# Patient Record
Sex: Male | Born: 1960
Health system: Southern US, Community
[De-identification: ages and names within clinical notes are randomized; demographics above are authoritative.]

## PROBLEM LIST (undated history)

## (undated) DIAGNOSIS — I10 Essential (primary) hypertension: Secondary | ICD-10-CM

## (undated) DIAGNOSIS — F32A Depression, unspecified: Secondary | ICD-10-CM

## (undated) DIAGNOSIS — E669 Obesity, unspecified: Secondary | ICD-10-CM

## (undated) DIAGNOSIS — G43909 Migraine, unspecified, not intractable, without status migrainosus: Secondary | ICD-10-CM

## (undated) DIAGNOSIS — F329 Major depressive disorder, single episode, unspecified: Secondary | ICD-10-CM

## (undated) DIAGNOSIS — K760 Fatty (change of) liver, not elsewhere classified: Secondary | ICD-10-CM

## (undated) DIAGNOSIS — R748 Abnormal levels of other serum enzymes: Secondary | ICD-10-CM

## (undated) HISTORY — PX: NASAL SINUS SURGERY: SHX719

## (undated) HISTORY — DX: Major depressive disorder, single episode, unspecified: F32.9

## (undated) HISTORY — DX: Abnormal levels of other serum enzymes: R74.8

## (undated) HISTORY — DX: Migraine, unspecified, not intractable, without status migrainosus: G43.909

## (undated) HISTORY — PX: GROIN MASS OPEN BIOPSY: SHX1714

## (undated) HISTORY — DX: Depression, unspecified: F32.A

## (undated) HISTORY — DX: Fatty (change of) liver, not elsewhere classified: K76.0

## (undated) HISTORY — DX: Essential (primary) hypertension: I10

## (undated) HISTORY — DX: Obesity, unspecified: E66.9

---

## 2006-08-06 ENCOUNTER — Emergency Department (HOSPITAL_COMMUNITY): Admission: EM | Admit: 2006-08-06 | Discharge: 2006-08-06 | Payer: Self-pay | Admitting: Family Medicine

## 2008-02-10 ENCOUNTER — Encounter (INDEPENDENT_AMBULATORY_CARE_PROVIDER_SITE_OTHER): Payer: Self-pay | Admitting: *Deleted

## 2008-02-10 ENCOUNTER — Ambulatory Visit (HOSPITAL_COMMUNITY): Admission: RE | Admit: 2008-02-10 | Discharge: 2008-02-10 | Payer: Self-pay | Admitting: *Deleted

## 2008-02-18 ENCOUNTER — Encounter: Admission: RE | Admit: 2008-02-18 | Discharge: 2008-02-18 | Payer: Self-pay | Admitting: *Deleted

## 2008-02-18 IMAGING — CT CT PELVIS W/ CM
2 of 6 series · 14 of 46 positions shown, 19 images · IV contrast ([ID] [id number])
Comparison: None available

CLINICAL DATA: Postsurgical blood clot.  Question DVT.

CT PELVIS WITH CONTRAST
TECHNIQUE: Multidetector CT imaging of the pelvis was performed
following the standard protocol during administration of
intravenous contrast.
Contrast: None available.

[Series 3: portal venous phase · axial · portal-venous · 0.98mm/px · z∈[-428,-163]mm · 11 of 63 slices shown, 16 images]
[im 5/63  soft-tissue]
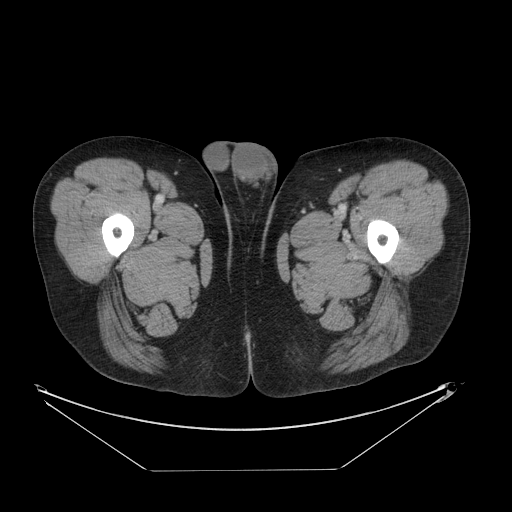
[im 5/63  bone]
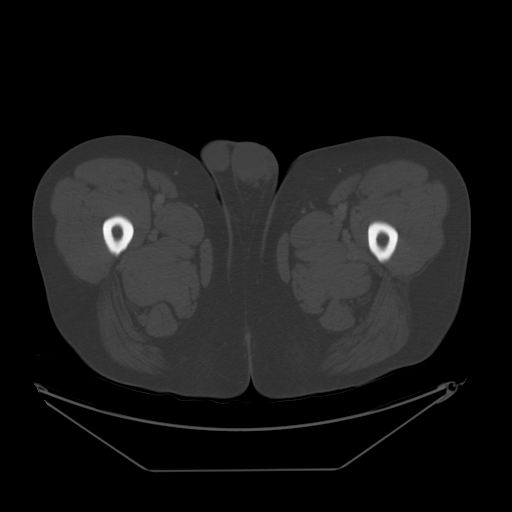
[im 9/63  soft-tissue]
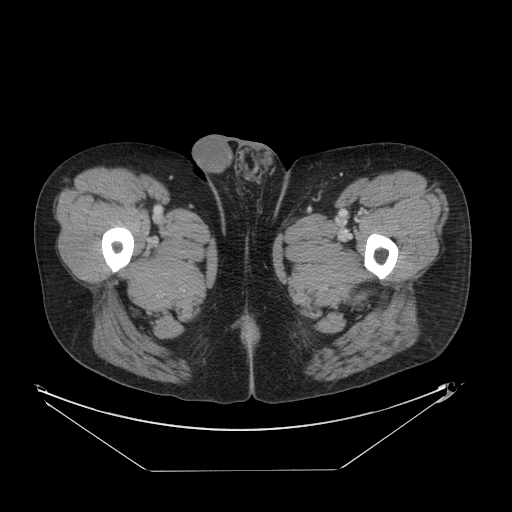
[im 18/63  soft-tissue]
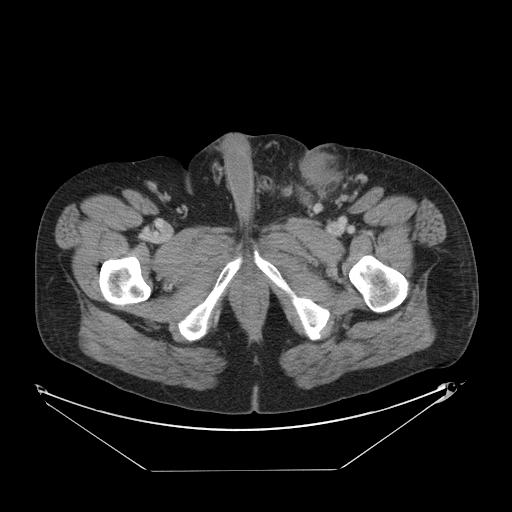
[im 23/63  soft-tissue]
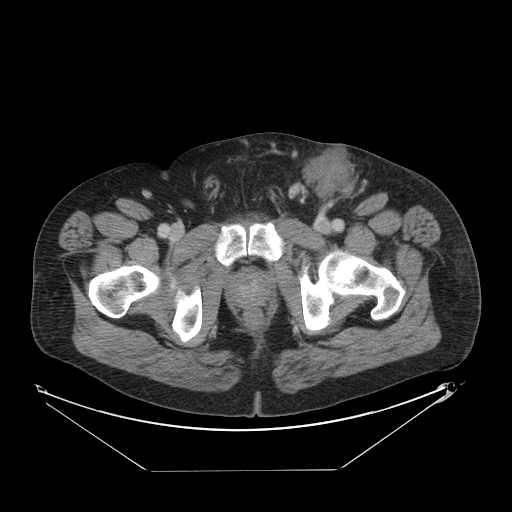
[im 27/63  soft-tissue]
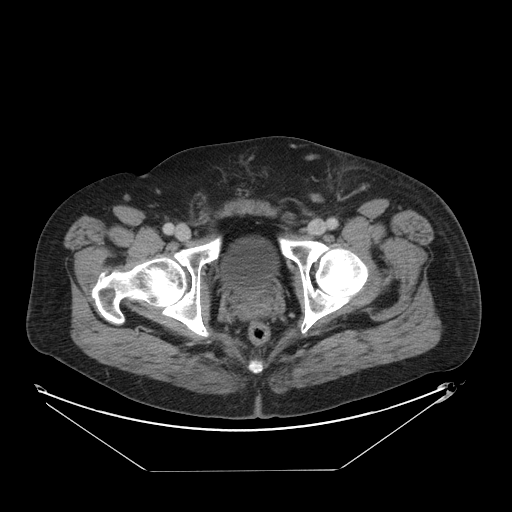
[im 36/63  soft-tissue]
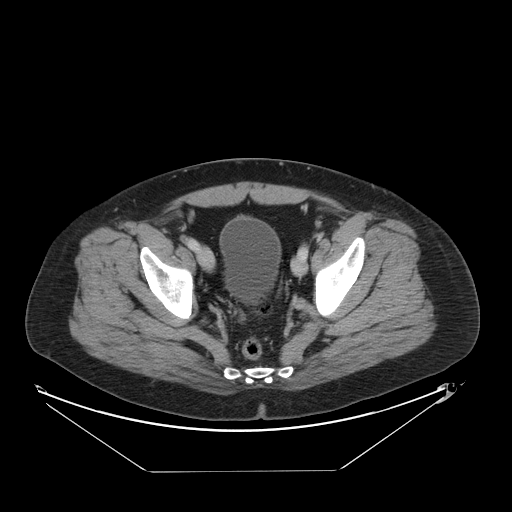
[im 40/63  soft-tissue]
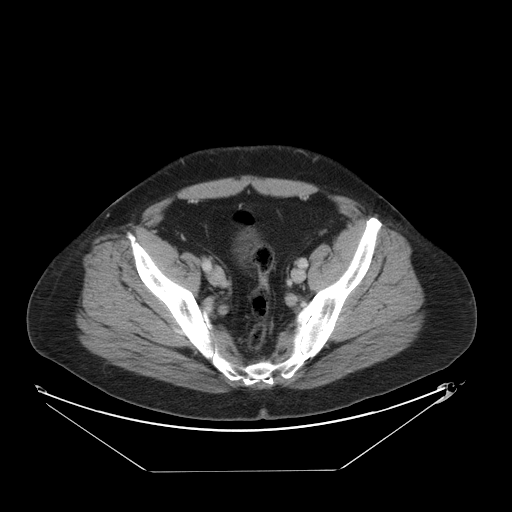
[im 45/63  soft-tissue]
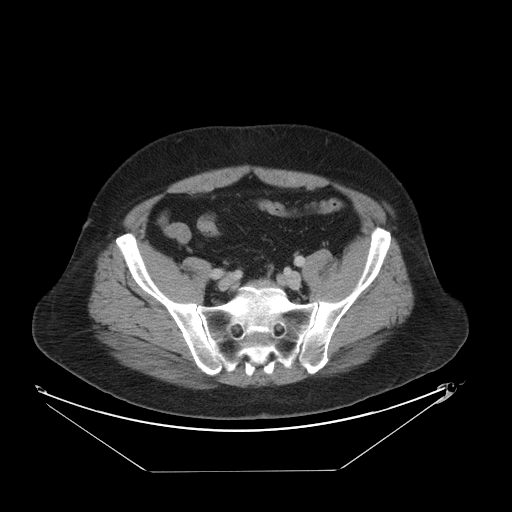
[im 45/63  lung]
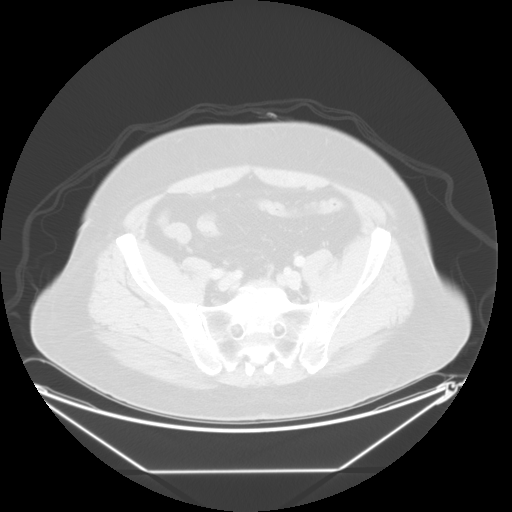
[im 49/63  lung]
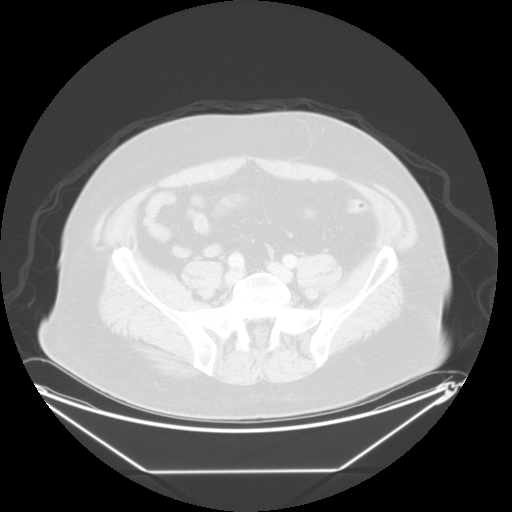
[im 54/63  soft-tissue]
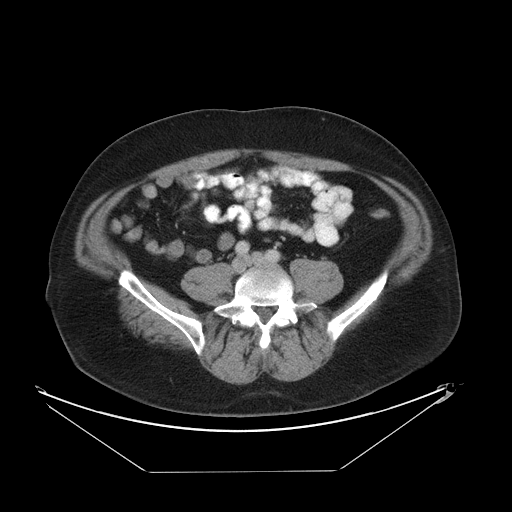
[im 54/63  lung]
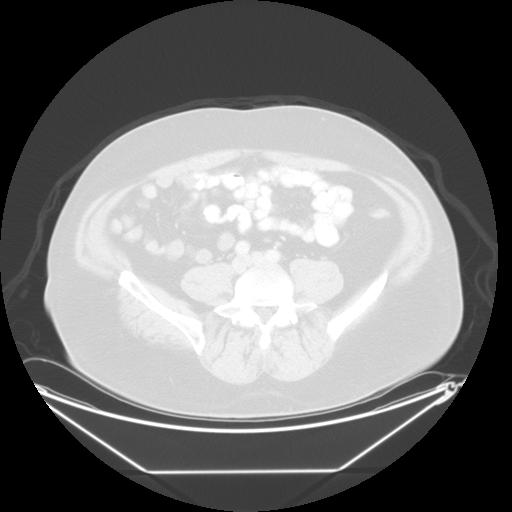
[im 54/63  bone]
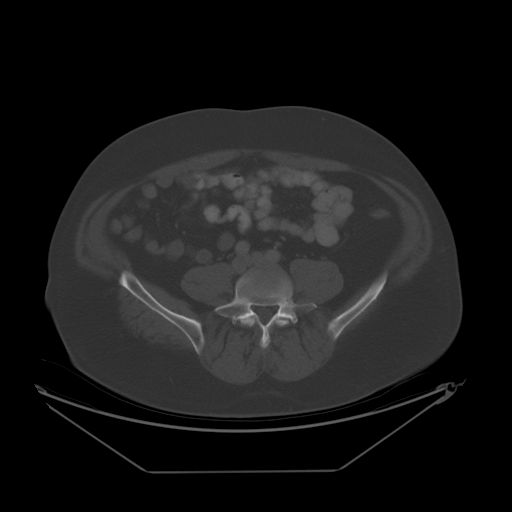
[im 58/63  soft-tissue]
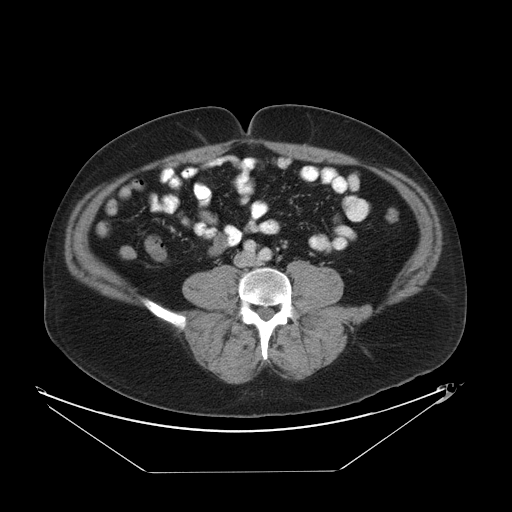
[im 58/63  lung]
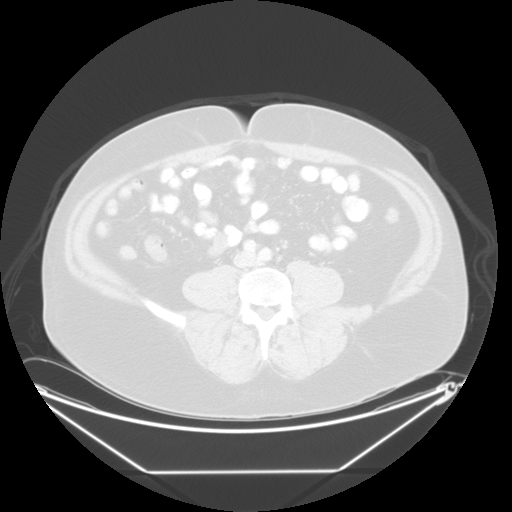

[Series 602: sagittal body · sagittal · 0.98mm/px · 3 of 180 slices shown]
[im 45/180  soft-tissue]
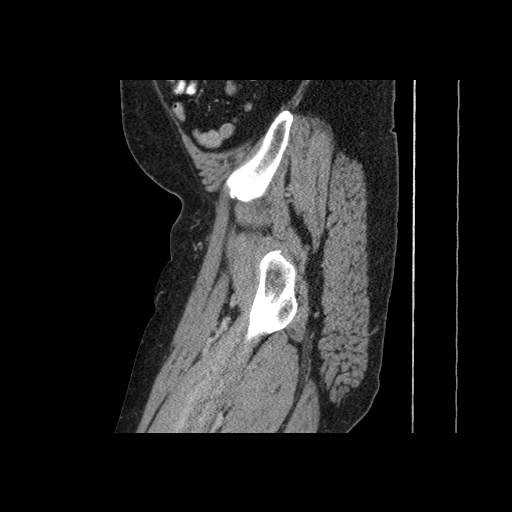
[im 90/180  soft-tissue]
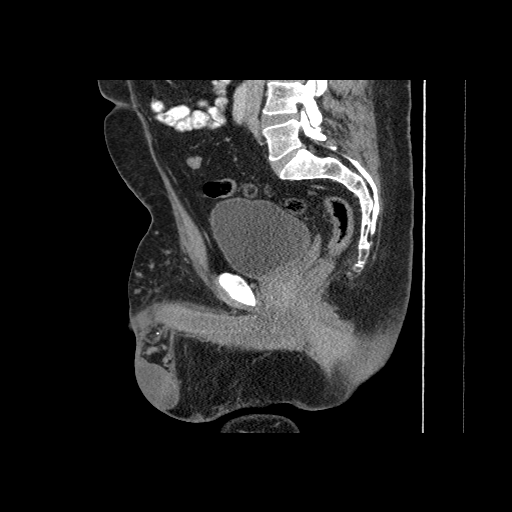
[im 135/180  soft-tissue]
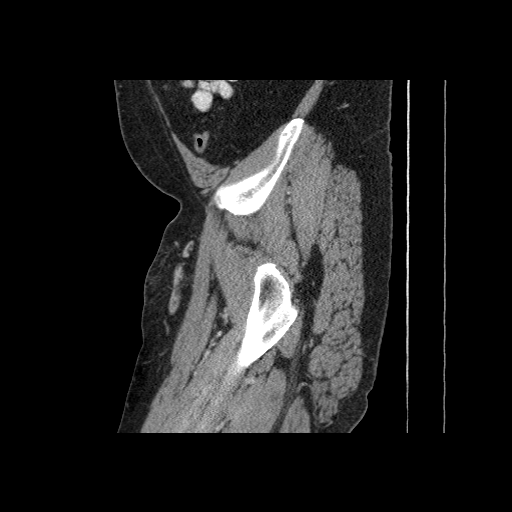

[14 of 46 positions shown; findings below may reference images not displayed]

FINDINGS: The rectosigmoid colon is within normal limits.  Most of
the colon is collapsed.  The appendix is visualized and within
normal limits.  The visualized small bowel is unremarkable.
Urinary bladder is normal.  Small amount of fat can be seen
extending into the inguinal canals bilaterally without bowel.
There is a subcutaneous soft tissue density on the left superficial
to the femoral vein.  The area is somewhat ill-defined, measuring
5.1 x 4.3 x 6.4 centimeters.  A slightly better defined area is
seen just posterior and medial measuring 3.0 x 2.0 cm.  The larger
area may represent to superficial clots.  There is no evidence for
DVT.  Bone windows are unremarkable.
IMPRESSION: 1.  6 cm soft tissue density overlying the left femoral vein most
likely represents superficial clot.  There is no evidence for DVT.
2.  Adjacent soft tissue may represent additional clot or lymph
node.
3.  Imaging within the anatomic pelvis is unremarkable.

## 2008-06-30 ENCOUNTER — Emergency Department (HOSPITAL_COMMUNITY): Admission: EM | Admit: 2008-06-30 | Discharge: 2008-06-30 | Payer: Self-pay | Admitting: Emergency Medicine

## 2009-06-08 ENCOUNTER — Ambulatory Visit (HOSPITAL_COMMUNITY): Admission: RE | Admit: 2009-06-08 | Discharge: 2009-06-08 | Payer: Self-pay | Admitting: Otolaryngology

## 2010-09-23 LAB — CREATININE, SERUM
Creatinine, Ser: 0.9 mg/dL (ref 0.4–1.5)
GFR calc non Af Amer: >60 mL/min (ref >60)

## 2010-09-23 LAB — BUN: BUN: 13 mg/dL (ref 6–23)

## 2010-11-05 NOTE — Op Note (Signed)
NAME:  Vernon Murray, Vernon Murray                 ACCOUNT NO.:  [id number]   MEDICAL RECORD NO.:  [id number]          PATIENT TYPE:  AMB   LOCATION:  DAY                          FACILITY:  [facility]   PHYSICIAN:  Alfonse Ras, MD   DATE OF BIRTH:  [birth year]   DATE OF PROCEDURE:  DATE OF DISCHARGE:                               OPERATIVE REPORT   PREOPERATIVE DIAGNOSIS:  Left inguinal mass.   POSTOPERATIVE DIAGNOSIS:  Left inguinal mass, probable hematoma,  questionable enlarged lymph node.   PROCEDURE:  Excision of left inguinal mass.   ANESTHESIA:  General.   SURGEON:  Alfonse Ras, M.D.   DESCRIPTION OF PROCEDURE:  The patient was taken to the operating room  and placed in a supine position.  After adequate general anesthesia was  induced using laryngeal mask, the left inguinal region was prepped and  draped in normal sterile fashion.  Using an oblique incision over the  palpable mass in the left inguinal region, I dissected down to the skin  and subcutaneous tissue.  Of note, there was significant amount of  oozing from the subcutaneous tissue which was controlled with  electrocautery.  The palpable mass was identified, it was quite dark in  color, and first I was concerned about melanoma.  I mobilized the mass  using a blunt and sharp dissection, it was interrupted and a hematoma  was found.  This was aspirated with the capsule surrounding it which may  possibly be a lymph node, it was completely excised and sent for  pathologic evaluation.  Adequate hemostasis was assured with one  interrupted 2-0 silk suture and a Bovie electrocautery.  Tissues were  injected with 0.5 Marcaine and the skin was closed with subcuticular 3-0  Monocryl and Dermabond was placed.  The patient tolerated the procedure  well and went to PACU in good condition.      Alfonse Ras, MD  Electronically Signed     KRE/MEDQ  D:  02/10/2008  T:  02/10/2008  Job:  [id number]

## 2011-08-07 ENCOUNTER — Ambulatory Visit (INDEPENDENT_AMBULATORY_CARE_PROVIDER_SITE_OTHER): Payer: Commercial Managed Care - PPO | Admitting: Psychology

## 2011-08-07 DIAGNOSIS — F331 Major depressive disorder, recurrent, moderate: Secondary | ICD-10-CM

## 2011-08-07 DIAGNOSIS — F411 Generalized anxiety disorder: Secondary | ICD-10-CM

## 2011-08-14 ENCOUNTER — Ambulatory Visit (INDEPENDENT_AMBULATORY_CARE_PROVIDER_SITE_OTHER): Payer: Commercial Managed Care - PPO | Admitting: Psychology

## 2011-08-14 DIAGNOSIS — F411 Generalized anxiety disorder: Secondary | ICD-10-CM

## 2011-08-14 DIAGNOSIS — F331 Major depressive disorder, recurrent, moderate: Secondary | ICD-10-CM

## 2011-08-21 ENCOUNTER — Ambulatory Visit (INDEPENDENT_AMBULATORY_CARE_PROVIDER_SITE_OTHER): Payer: 59 | Admitting: Psychology

## 2011-08-21 DIAGNOSIS — F411 Generalized anxiety disorder: Secondary | ICD-10-CM

## 2011-08-21 DIAGNOSIS — F331 Major depressive disorder, recurrent, moderate: Secondary | ICD-10-CM

## 2011-08-28 ENCOUNTER — Ambulatory Visit (INDEPENDENT_AMBULATORY_CARE_PROVIDER_SITE_OTHER): Payer: 59 | Admitting: Psychology

## 2011-08-28 DIAGNOSIS — F411 Generalized anxiety disorder: Secondary | ICD-10-CM

## 2011-08-28 DIAGNOSIS — F331 Major depressive disorder, recurrent, moderate: Secondary | ICD-10-CM

## 2011-09-04 ENCOUNTER — Ambulatory Visit (INDEPENDENT_AMBULATORY_CARE_PROVIDER_SITE_OTHER): Payer: 59 | Admitting: Psychology

## 2011-09-04 DIAGNOSIS — F331 Major depressive disorder, recurrent, moderate: Secondary | ICD-10-CM

## 2011-09-04 DIAGNOSIS — F411 Generalized anxiety disorder: Secondary | ICD-10-CM

## 2011-09-11 ENCOUNTER — Ambulatory Visit (INDEPENDENT_AMBULATORY_CARE_PROVIDER_SITE_OTHER): Payer: 59 | Admitting: Psychology

## 2011-09-11 DIAGNOSIS — F411 Generalized anxiety disorder: Secondary | ICD-10-CM

## 2011-09-11 DIAGNOSIS — F331 Major depressive disorder, recurrent, moderate: Secondary | ICD-10-CM

## 2011-09-26 ENCOUNTER — Ambulatory Visit (INDEPENDENT_AMBULATORY_CARE_PROVIDER_SITE_OTHER): Payer: 59 | Admitting: Psychology

## 2011-09-26 DIAGNOSIS — F411 Generalized anxiety disorder: Secondary | ICD-10-CM

## 2011-09-26 DIAGNOSIS — F331 Major depressive disorder, recurrent, moderate: Secondary | ICD-10-CM

## 2011-10-03 ENCOUNTER — Ambulatory Visit (INDEPENDENT_AMBULATORY_CARE_PROVIDER_SITE_OTHER): Payer: 59 | Admitting: Psychology

## 2011-10-03 DIAGNOSIS — F331 Major depressive disorder, recurrent, moderate: Secondary | ICD-10-CM

## 2011-10-03 DIAGNOSIS — F411 Generalized anxiety disorder: Secondary | ICD-10-CM

## 2011-10-09 ENCOUNTER — Ambulatory Visit (INDEPENDENT_AMBULATORY_CARE_PROVIDER_SITE_OTHER): Payer: 59 | Admitting: Psychology

## 2011-10-09 DIAGNOSIS — F411 Generalized anxiety disorder: Secondary | ICD-10-CM

## 2011-10-09 DIAGNOSIS — F331 Major depressive disorder, recurrent, moderate: Secondary | ICD-10-CM

## 2011-10-21 ENCOUNTER — Ambulatory Visit (INDEPENDENT_AMBULATORY_CARE_PROVIDER_SITE_OTHER): Payer: 59 | Admitting: Psychology

## 2011-10-21 DIAGNOSIS — F411 Generalized anxiety disorder: Secondary | ICD-10-CM

## 2011-10-21 DIAGNOSIS — F331 Major depressive disorder, recurrent, moderate: Secondary | ICD-10-CM

## 2011-11-06 ENCOUNTER — Ambulatory Visit (INDEPENDENT_AMBULATORY_CARE_PROVIDER_SITE_OTHER): Payer: 59 | Admitting: Psychology

## 2011-11-06 DIAGNOSIS — F411 Generalized anxiety disorder: Secondary | ICD-10-CM

## 2011-11-06 DIAGNOSIS — F331 Major depressive disorder, recurrent, moderate: Secondary | ICD-10-CM

## 2011-11-12 ENCOUNTER — Ambulatory Visit (INDEPENDENT_AMBULATORY_CARE_PROVIDER_SITE_OTHER): Payer: 59 | Admitting: Psychology

## 2011-11-12 DIAGNOSIS — F411 Generalized anxiety disorder: Secondary | ICD-10-CM

## 2011-11-12 DIAGNOSIS — F331 Major depressive disorder, recurrent, moderate: Secondary | ICD-10-CM

## 2011-11-19 ENCOUNTER — Ambulatory Visit (INDEPENDENT_AMBULATORY_CARE_PROVIDER_SITE_OTHER): Payer: 59 | Admitting: Psychology

## 2011-11-19 DIAGNOSIS — F411 Generalized anxiety disorder: Secondary | ICD-10-CM

## 2011-11-19 DIAGNOSIS — F331 Major depressive disorder, recurrent, moderate: Secondary | ICD-10-CM

## 2011-11-27 ENCOUNTER — Ambulatory Visit (INDEPENDENT_AMBULATORY_CARE_PROVIDER_SITE_OTHER): Payer: 59 | Admitting: Psychology

## 2011-11-27 DIAGNOSIS — F331 Major depressive disorder, recurrent, moderate: Secondary | ICD-10-CM

## 2011-11-27 DIAGNOSIS — F411 Generalized anxiety disorder: Secondary | ICD-10-CM

## 2011-12-04 ENCOUNTER — Ambulatory Visit (INDEPENDENT_AMBULATORY_CARE_PROVIDER_SITE_OTHER): Payer: 59 | Admitting: Psychology

## 2011-12-04 DIAGNOSIS — F411 Generalized anxiety disorder: Secondary | ICD-10-CM

## 2011-12-04 DIAGNOSIS — F331 Major depressive disorder, recurrent, moderate: Secondary | ICD-10-CM

## 2011-12-24 ENCOUNTER — Ambulatory Visit (INDEPENDENT_AMBULATORY_CARE_PROVIDER_SITE_OTHER): Payer: 59 | Admitting: Psychology

## 2011-12-24 DIAGNOSIS — F331 Major depressive disorder, recurrent, moderate: Secondary | ICD-10-CM

## 2011-12-24 DIAGNOSIS — F411 Generalized anxiety disorder: Secondary | ICD-10-CM

## 2012-02-02 ENCOUNTER — Ambulatory Visit (INDEPENDENT_AMBULATORY_CARE_PROVIDER_SITE_OTHER): Payer: 59 | Admitting: Psychology

## 2012-02-02 DIAGNOSIS — F331 Major depressive disorder, recurrent, moderate: Secondary | ICD-10-CM

## 2012-02-02 DIAGNOSIS — F411 Generalized anxiety disorder: Secondary | ICD-10-CM

## 2012-03-01 ENCOUNTER — Ambulatory Visit (INDEPENDENT_AMBULATORY_CARE_PROVIDER_SITE_OTHER): Payer: 59 | Admitting: Psychology

## 2012-03-01 DIAGNOSIS — F411 Generalized anxiety disorder: Secondary | ICD-10-CM

## 2012-03-01 DIAGNOSIS — F331 Major depressive disorder, recurrent, moderate: Secondary | ICD-10-CM

## 2012-05-12 ENCOUNTER — Ambulatory Visit (INDEPENDENT_AMBULATORY_CARE_PROVIDER_SITE_OTHER): Payer: 59 | Admitting: Psychology

## 2012-05-12 DIAGNOSIS — F411 Generalized anxiety disorder: Secondary | ICD-10-CM

## 2012-05-12 DIAGNOSIS — F331 Major depressive disorder, recurrent, moderate: Secondary | ICD-10-CM

## 2012-06-30 ENCOUNTER — Ambulatory Visit (INDEPENDENT_AMBULATORY_CARE_PROVIDER_SITE_OTHER): Payer: 59 | Admitting: Psychology

## 2012-06-30 DIAGNOSIS — F331 Major depressive disorder, recurrent, moderate: Secondary | ICD-10-CM

## 2012-06-30 DIAGNOSIS — F411 Generalized anxiety disorder: Secondary | ICD-10-CM

## 2012-09-01 ENCOUNTER — Ambulatory Visit: Payer: 59 | Admitting: Psychology

## 2012-09-07 ENCOUNTER — Ambulatory Visit (INDEPENDENT_AMBULATORY_CARE_PROVIDER_SITE_OTHER): Payer: 59 | Admitting: Psychology

## 2012-09-07 DIAGNOSIS — F331 Major depressive disorder, recurrent, moderate: Secondary | ICD-10-CM

## 2012-09-07 DIAGNOSIS — F411 Generalized anxiety disorder: Secondary | ICD-10-CM

## 2012-12-07 ENCOUNTER — Ambulatory Visit (INDEPENDENT_AMBULATORY_CARE_PROVIDER_SITE_OTHER): Payer: 59 | Admitting: Psychology

## 2012-12-07 DIAGNOSIS — F331 Major depressive disorder, recurrent, moderate: Secondary | ICD-10-CM

## 2012-12-07 DIAGNOSIS — F411 Generalized anxiety disorder: Secondary | ICD-10-CM

## 2014-01-23 ENCOUNTER — Ambulatory Visit: Payer: Self-pay | Admitting: Family Medicine

## 2014-02-02 ENCOUNTER — Other Ambulatory Visit: Payer: Self-pay | Admitting: Urgent Care

## 2014-02-02 LAB — HEPATIC FUNCTION PANEL A (ARMC)
ALBUMIN: 4.1 g/dL (ref 3.4–5.0)
Alkaline Phosphatase: 68 U/L
Bilirubin, Direct: 0.2 mg/dL (ref 0.00–0.20)
Bilirubin,Total: 0.6 mg/dL (ref 0.2–1.0)
SGOT(AST): 53 U/L — ABNORMAL HIGH (ref 15–37)
SGPT (ALT): 150 U/L — ABNORMAL HIGH
Total Protein: 7.5 g/dL (ref 6.4–8.2)

## 2014-02-02 LAB — FERRITIN: Ferritin (ARMC): 295 ng/mL (ref 8–388)

## 2014-02-02 LAB — PROTIME-INR
INR: 1
Prothrombin Time: 13.2 secs (ref 11.5–14.7)

## 2014-02-02 LAB — IRON: IRON: 106 ug/dL (ref 65–175)

## 2014-02-06 LAB — AFP TUMOR MARKER: AFP-Tumor Marker: 3.2 ng/mL (ref 0.0–8.3)

## 2014-02-07 ENCOUNTER — Other Ambulatory Visit (HOSPITAL_COMMUNITY): Payer: Self-pay | Admitting: Sports Medicine

## 2014-02-07 DIAGNOSIS — M75122 Complete rotator cuff tear or rupture of left shoulder, not specified as traumatic: Secondary | ICD-10-CM

## 2014-02-16 ENCOUNTER — Ambulatory Visit (HOSPITAL_COMMUNITY)
Admission: RE | Admit: 2014-02-16 | Discharge: 2014-02-16 | Disposition: A | Discharge status: Home / Self Care | Payer: 59 | Source: Ambulatory Visit | Attending: Sports Medicine | Admitting: Sports Medicine

## 2014-02-16 DIAGNOSIS — R937 Abnormal findings on diagnostic imaging of other parts of musculoskeletal system: Secondary | ICD-10-CM | POA: Diagnosis not present

## 2014-02-16 DIAGNOSIS — M25619 Stiffness of unspecified shoulder, not elsewhere classified: Secondary | ICD-10-CM | POA: Insufficient documentation

## 2014-02-16 DIAGNOSIS — M25519 Pain in unspecified shoulder: Secondary | ICD-10-CM | POA: Diagnosis present

## 2014-02-16 DIAGNOSIS — M75122 Complete rotator cuff tear or rupture of left shoulder, not specified as traumatic: Secondary | ICD-10-CM

## 2014-03-02 ENCOUNTER — Other Ambulatory Visit: Payer: Self-pay | Admitting: Urgent Care

## 2014-03-02 LAB — HEPATIC FUNCTION PANEL A (ARMC)
ALT: 102 U/L — AB
Albumin: 3.6 g/dL (ref 3.4–5.0)
Alkaline Phosphatase: 54 U/L
BILIRUBIN DIRECT: 0.2 mg/dL (ref 0.00–0.20)
BILIRUBIN TOTAL: 0.5 mg/dL (ref 0.2–1.0)
SGOT(AST): 54 U/L — ABNORMAL HIGH (ref 15–37)
Total Protein: 6.8 g/dL (ref 6.4–8.2)

## 2014-03-14 ENCOUNTER — Encounter: Payer: Self-pay | Admitting: Sports Medicine

## 2014-03-23 ENCOUNTER — Encounter: Payer: Self-pay | Admitting: Sports Medicine

## 2014-04-23 ENCOUNTER — Encounter: Payer: Self-pay | Admitting: Sports Medicine

## 2014-08-30 ENCOUNTER — Ambulatory Visit: Payer: Self-pay | Admitting: Family Medicine

## 2014-10-10 DIAGNOSIS — E669 Obesity, unspecified: Secondary | ICD-10-CM | POA: Insufficient documentation

## 2014-10-10 DIAGNOSIS — I10 Essential (primary) hypertension: Secondary | ICD-10-CM | POA: Insufficient documentation

## 2014-10-10 DIAGNOSIS — F32A Depression, unspecified: Secondary | ICD-10-CM

## 2014-10-10 DIAGNOSIS — R748 Abnormal levels of other serum enzymes: Secondary | ICD-10-CM | POA: Insufficient documentation

## 2014-10-10 DIAGNOSIS — K760 Fatty (change of) liver, not elsewhere classified: Secondary | ICD-10-CM | POA: Insufficient documentation

## 2014-10-10 DIAGNOSIS — F329 Major depressive disorder, single episode, unspecified: Secondary | ICD-10-CM

## 2014-12-04 ENCOUNTER — Encounter: Payer: Self-pay | Admitting: Family Medicine

## 2014-12-04 ENCOUNTER — Ambulatory Visit (INDEPENDENT_AMBULATORY_CARE_PROVIDER_SITE_OTHER): Payer: 59 | Admitting: Family Medicine

## 2014-12-04 VITALS — BP 138/82 | HR 77 | Temp 97.6°F | Wt 266.0 lb

## 2014-12-04 DIAGNOSIS — E786 Lipoprotein deficiency: Secondary | ICD-10-CM | POA: Diagnosis not present

## 2014-12-04 DIAGNOSIS — R899 Unspecified abnormal finding in specimens from other organs, systems and tissues: Secondary | ICD-10-CM | POA: Diagnosis not present

## 2014-12-04 DIAGNOSIS — D709 Neutropenia, unspecified: Secondary | ICD-10-CM

## 2014-12-04 DIAGNOSIS — R748 Abnormal levels of other serum enzymes: Secondary | ICD-10-CM | POA: Diagnosis not present

## 2014-12-04 DIAGNOSIS — K760 Fatty (change of) liver, not elsewhere classified: Secondary | ICD-10-CM | POA: Diagnosis not present

## 2014-12-04 NOTE — Progress Notes (Signed)
BP 138/82 mmHg  Pulse 77  Temp(Src) 97.6 F (36.4 C)  Wt 266 lb (120.657 kg)  SpO2 96%   Subjective:    Patient ID: Vernon Murray, male    DOB: [birth year], 54 y.o.   MRN: [id number]  HPI: Vernon Murray is a 54 y.o. male  Chief Complaint  Patient presents with  . Elevated Hepatic Enzymes   History of elevated liver enzymes He saw the gastroenterologist His wife would like him to get checked out, have the labs rechecked He feels fine; no abd pain, no nausea, no jaundice  Locked up shoulder, doing therapy; it's the left shoulder  Retired a few months ago and enjoying new activities, trap shooting and wood turning  Low neutrophils noted earlier; no recurrent infections like boils or sinus infections  Low HDL; always been an issues; not sure if inherited; both parents are on a lot of medicines, cholesterol might be one  He does not tolerate aspirin well when we discussed it for cardioprevention, bleeds easily when taking aspirin  Relevant past medical, surgical, family and social history reviewed and updated as indicated. Interim medical history since our last visit reviewed. Allergies and medications reviewed and updated.  Review of Systems  Gastrointestinal: Negative for nausea and abdominal pain.   Per HPI unless specifically indicated above     Objective:    BP 138/82 mmHg  Pulse 77  Temp(Src) 97.6 F (36.4 C)  Wt 266 lb (120.657 kg)  SpO2 96%  Wt Readings from Last 3 Encounters:  12/04/14 266 lb (120.657 kg)  08/30/14 264 lb (119.75 kg)    Physical Exam  Constitutional: He appears well-developed and well-nourished. No distress.  HENT:  Head: Normocephalic and atraumatic.  Nose: No rhinorrhea.  Mouth/Throat: Mucous membranes are normal.  Eyes: EOM are normal. No scleral icterus.  Neck: No JVD present.  Cardiovascular: Normal rate, regular rhythm and normal heart sounds.   No murmur heard. Pulmonary/Chest: Effort normal and breath sounds normal.  No respiratory distress. He has no wheezes.  Abdominal: Soft. Bowel sounds are normal. He exhibits no distension. There is no tenderness.  Musculoskeletal: He exhibits no edema.  Neurological: He is alert. He displays no tremor. He exhibits normal muscle tone.  Skin: Skin is warm and dry. No rash noted. He is not diaphoretic. No cyanosis.  Psychiatric: He has a normal mood and affect. His behavior is normal. Judgment and thought content normal.  Nursing note and vitals reviewed.   Results for orders placed or performed in visit on 03/02/14  Hepatic Function Panel A [facility])  Result Value Ref Range   SGOT(AST) 54 (H) 15-37 Unit/L   SGPT (ALT) 102 (H) U/L   Alkaline Phosphatase 54 Unit/L   Albumin 3.6 3.4-5.0 g/dL   Total Protein 6.8 7.8-2.9 g/dL   Bilirubin,Total 0.5 5.6-2.1 mg/dL   Bilirubin, Direct 0.2 0.00-0.20 mg/dL      Assessment & Plan:   Problem List Items Addressed This Visit      Digestive   Fatty liver    Noted on [place] August 2015; suggest modest weight loss        Other   Elevated liver enzymes - Primary   Relevant Orders   Comprehensive metabolic panel    Other Visit Diagnoses    Abnormal laboratory test        abnormal smooth muscle antibody, recheck    Relevant Orders    Mitochondrial antibodies    Anti-smooth muscle antibody, IgG  Low HDL (under 40)        ongoing issue; recheck lipids now that he has been more active; consider adding niacin if still low    Relevant Orders    Lipid Panel w/o Chol/HDL Ratio    Neutropenia        last ANC was 1200; recheck cbc today    Relevant Orders    CBC with Differential/Platelet        Follow up plan: Return in about 6 months (around 06/05/2015) for and physical when due (Feb).

## 2014-12-04 NOTE — Assessment & Plan Note (Signed)
Noted on [place] August 2015; suggest modest weight loss

## 2014-12-04 NOTE — Patient Instructions (Addendum)
Keep up the good work with exercise and healthy activities Try to limit saturated fats Aspirin would be recommended for primary cardiac prevention; however, if you tend to bleed or this would cause more harm than good, it may not be right for now for you We'll contact you about your lab results Try to trim down a few more pounds by limiting total calories, staying active, and drinking 64 ounces of water a day Please contact me or my certified medical assistant, Claudine Mouton, if we can answer any questions or be of assistance in any way.  You can reach me directly through MyChart or by calling Amy here at the office at [phone].

## 2014-12-05 ENCOUNTER — Encounter: Payer: Self-pay | Admitting: Family Medicine

## 2014-12-05 LAB — CBC WITH DIFFERENTIAL/PLATELET
Basophils Absolute: 0 10*3/uL (ref 0.0–0.2)
Basos: 0 %
EOS (ABSOLUTE): 0.2 10*3/uL (ref 0.0–0.4)
EOS: 3 %
HEMATOCRIT: 45.8 % (ref 37.5–51.0)
Hemoglobin: 15.3 g/dL (ref 12.6–17.7)
Immature Grans (Abs): 0 10*3/uL (ref 0.0–0.1)
Immature Granulocytes: 0 %
LYMPHS ABS: 2.2 10*3/uL (ref 0.7–3.1)
Lymphs: 39 %
MCH: 30.1 pg (ref 26.6–33.0)
MCHC: 33.4 g/dL (ref 31.5–35.7)
MCV: 90 fL (ref 79–97)
MONOCYTES: 13 %
Monocytes Absolute: 0.7 10*3/uL (ref 0.1–0.9)
NEUTROS ABS: 2.5 10*3/uL (ref 1.4–7.0)
Neutrophils: 45 %
Platelets: 213 10*3/uL (ref 150–379)
RBC: 5.08 x10E6/uL (ref 4.14–5.80)
RDW: 13.6 % (ref 12.3–15.4)
WBC: 5.6 10*3/uL (ref 3.4–10.8)

## 2014-12-05 LAB — COMPREHENSIVE METABOLIC PANEL
A/G RATIO: 2 (ref 1.1–2.5)
ALBUMIN: 4.5 g/dL (ref 3.5–5.5)
ALT: 28 IU/L (ref 0–44)
AST: 24 IU/L (ref 0–40)
Alkaline Phosphatase: 55 IU/L (ref 39–117)
BILIRUBIN TOTAL: 0.4 mg/dL (ref 0.0–1.2)
BUN/Creatinine Ratio: 24 — ABNORMAL HIGH (ref 9–20)
BUN: 19 mg/dL (ref 6–24)
CO2: 25 mmol/L (ref 18–29)
Calcium: 9.6 mg/dL (ref 8.7–10.2)
Chloride: 102 mmol/L (ref 97–108)
Creatinine, Ser: 0.78 mg/dL (ref 0.76–1.27)
GFR, EST AFRICAN AMERICAN: 117 mL/min/{1.73_m2} (ref >59)
GFR, EST NON AFRICAN AMERICAN: 102 mL/min/{1.73_m2} (ref >59)
GLUCOSE: 75 mg/dL (ref 65–99)
Globulin, Total: 2.2 g/dL (ref 1.5–4.5)
POTASSIUM: 4.5 mmol/L (ref 3.5–5.2)
Sodium: 141 mmol/L (ref 134–144)
TOTAL PROTEIN: 6.7 g/dL (ref 6.0–8.5)

## 2014-12-05 LAB — LIPID PANEL W/O CHOL/HDL RATIO
CHOLESTEROL TOTAL: 172 mg/dL (ref 100–199)
HDL: 41 mg/dL (ref >39)
LDL CALC: 94 mg/dL (ref 0–99)
TRIGLYCERIDES: 186 mg/dL — AB (ref 0–149)
VLDL CHOLESTEROL CAL: 37 mg/dL (ref 5–40)

## 2014-12-05 LAB — MITOCHONDRIAL ANTIBODIES: MITOCHONDRIAL AB: 7.3 U (ref 0.0–20.0)

## 2014-12-05 LAB — ANTI-SMOOTH MUSCLE ANTIBODY, IGG: SMOOTH MUSCLE AB: 18 U (ref 0–19)

## 2014-12-29 ENCOUNTER — Ambulatory Visit (INDEPENDENT_AMBULATORY_CARE_PROVIDER_SITE_OTHER): Payer: 59 | Admitting: Family Medicine

## 2014-12-29 ENCOUNTER — Encounter: Payer: Self-pay | Admitting: Family Medicine

## 2014-12-29 VITALS — BP 136/79 | HR 71 | Temp 98.0°F | Wt 268.0 lb

## 2014-12-29 DIAGNOSIS — R748 Abnormal levels of other serum enzymes: Secondary | ICD-10-CM | POA: Diagnosis not present

## 2014-12-29 DIAGNOSIS — I499 Cardiac arrhythmia, unspecified: Secondary | ICD-10-CM | POA: Diagnosis not present

## 2014-12-29 DIAGNOSIS — E860 Dehydration: Secondary | ICD-10-CM

## 2014-12-29 LAB — UA/M W/RFLX CULTURE, ROUTINE
Bilirubin, UA: NEGATIVE
GLUCOSE, UA: NEGATIVE
KETONES UA: NEGATIVE
Leukocytes, UA: NEGATIVE
Nitrite, UA: NEGATIVE
PH UA: 5.5 (ref 5.0–7.5)
Protein, UA: NEGATIVE
Specific Gravity, UA: 1.01 (ref 1.005–1.030)
UUROB: 0.2 mg/dL (ref 0.2–1.0)

## 2014-12-29 LAB — MICROSCOPIC EXAMINATION
EPITHELIAL CELLS (NON RENAL): NONE SEEN /HPF (ref 0–10)
WBC, UA: NONE SEEN /hpf (ref 0–?)

## 2014-12-29 NOTE — Patient Instructions (Addendum)
I will suggest an aspirin 81 mg daily (coated to protect the stomach) if you tolerate it Do stay well-hydrated and stay out of the heat, take frequent breaks to cool off If you have not heard anything from my staff in a week about any orders/referrals/studies from today, please contact us here to follow-up [phone] Just call if another episode of skipped beats occurs and we'll set up a Holter monitor If you ever develop chest pain, do call [phone]
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
## 2014-12-29 NOTE — Assessment & Plan Note (Addendum)
Reviewed last 3 liver enzymes in the medical record; suggested recheck in 6 months, patient wants to wait until Feb; he agrees to come in before then if he becomes symptomatic

## 2014-12-30 LAB — BASIC METABOLIC PANEL
BUN/Creatinine Ratio: 16 (ref 9–20)
BUN: 14 mg/dL (ref 6–24)
CALCIUM: 9.4 mg/dL (ref 8.7–10.2)
CHLORIDE: 101 mmol/L (ref 97–108)
CO2: 24 mmol/L (ref 18–29)
Creatinine, Ser: 0.9 mg/dL (ref 0.76–1.27)
GFR calc Af Amer: 111 mL/min/{1.73_m2} (ref >59)
GFR, EST NON AFRICAN AMERICAN: 96 mL/min/{1.73_m2} (ref >59)
GLUCOSE: 89 mg/dL (ref 65–99)
Potassium: 4.5 mmol/L (ref 3.5–5.2)
Sodium: 140 mmol/L (ref 134–144)

## 2014-12-30 LAB — MAGNESIUM: MAGNESIUM: 2.1 mg/dL (ref 1.6–2.3)

## 2015-03-08 ENCOUNTER — Ambulatory Visit: Payer: 59

## 2015-03-08 DIAGNOSIS — Z23 Encounter for immunization: Secondary | ICD-10-CM

## 2015-07-30 ENCOUNTER — Ambulatory Visit (INDEPENDENT_AMBULATORY_CARE_PROVIDER_SITE_OTHER): Payer: BLUE CROSS/BLUE SHIELD | Admitting: Family Medicine

## 2015-07-30 ENCOUNTER — Encounter: Payer: Self-pay | Admitting: Family Medicine

## 2015-07-30 VITALS — BP 133/86 | HR 68 | Temp 97.8°F | Ht 69.5 in | Wt 267.0 lb

## 2015-07-30 DIAGNOSIS — F32A Depression, unspecified: Secondary | ICD-10-CM

## 2015-07-30 DIAGNOSIS — I10 Essential (primary) hypertension: Secondary | ICD-10-CM

## 2015-07-30 DIAGNOSIS — E669 Obesity, unspecified: Secondary | ICD-10-CM

## 2015-07-30 DIAGNOSIS — Z0000 Encounter for general adult medical examination without abnormal findings: Secondary | ICD-10-CM | POA: Diagnosis not present

## 2015-07-30 DIAGNOSIS — F329 Major depressive disorder, single episode, unspecified: Secondary | ICD-10-CM

## 2015-07-30 NOTE — Assessment & Plan Note (Addendum)
Check vit D, TSH; consider light therapy during winter months

## 2015-07-30 NOTE — Assessment & Plan Note (Signed)
USPSTF grade A and B recommendations reviewed with patient; age-appropriate recommendations, preventive care, screening tests, etc discussed and encouraged; healthy living encouraged; see AVS for patient education given to patient; he declined colonoscopy, declined Cologuard, declined PSA testing

## 2015-07-30 NOTE — Assessment & Plan Note (Signed)
Encouraged modest weight loss, see AVS; check TSH

## 2015-07-30 NOTE — Patient Instructions (Addendum)
Try to limit saturated fats in your diet (bologna, hot dogs, barbeque, cheeseburgers, hamburgers, steak, bacon, sausage, cheese, etc.) and get more fresh fruits, vegetables, and whole grains  Check out the information at familydoctor.org entitled "What It Takes to Lose Weight" Try to lose between 1-2 pounds per week by taking in fewer calories and burning off more calories You can succeed by limiting portions, limiting foods dense in calories and fat, becoming more active, and drinking 8 glasses of water a day (64 ounces) Don't skip meals, especially breakfast, as skipping meals may alter your metabolism Do not use over-the-counter weight loss pills or gimmicks that claim rapid weight loss A healthy BMI (or body mass index) is between 18.5 and 24.9 You can calculate your ideal BMI at the NIH website [employer]  Your goal blood pressure is less than 140 mmHg on top. Try to follow the DASH guidelines (DASH stands for Dietary Approaches to Stop Hypertension) Try to limit the sodium in your diet.  Ideally, consume less than 1.5 grams (less than 1,500mg ) per day. Do not add salt when cooking or at the table.  Check the sodium amount on labels when shopping, and choose items lower in sodium when given a choice. Avoid or limit foods that already contain a lot of sodium. Eat a diet rich in fruits and vegetables and whole grains.  i do recommend a baby 81 mg coated aspirin daily for heart protection  If you change your mind about colorectal cancer screening, PLEASE call us; we'll be glad to order the Cologuard stool test or refer you for a colonoscopy if you agree to screening; colon cancer is the third leading cause of death in men from cancer  Health Maintenance, Male A healthy lifestyle and preventative care can promote health and wellness.  Maintain regular health, dental, and eye exams.  Eat a healthy diet. Foods like vegetables, fruits,  whole grains, low-fat dairy products, and lean protein foods contain the nutrients you need and are low in calories. Decrease your intake of foods high in solid fats, added sugars, and salt. Get information about a proper diet from your health care provider, if necessary.  Regular physical exercise is one of the most important things you can do for your health. Most adults should get at least 150 minutes of moderate-intensity exercise (any activity that increases your heart rate and causes you to sweat) each week. In addition, most adults need muscle-strengthening exercises on 2 or more days a week.   Maintain a healthy weight. The body mass index (BMI) is a screening tool to identify possible weight problems. It provides an estimate of body fat based on height and weight. Your health care provider can find your BMI and can help you achieve or maintain a healthy weight. For males 20 years and older:  A BMI below 18.5 is considered underweight.  A BMI of 18.5 to 24.9 is normal.  A BMI of 25 to 29.9 is considered overweight.  A BMI of 30 and above is considered obese.  Maintain normal blood lipids and cholesterol by exercising and minimizing your intake of saturated fat. Eat a balanced diet with plenty of fruits and vegetables. Blood tests for lipids and cholesterol should begin at age 67 and be repeated every 5 years. If your lipid or cholesterol levels are high, you are over age 56, or you are at high risk for heart disease, you may need your cholesterol levels checked more frequently.Ongoing high lipid and cholesterol levels should be  treated with medicines if diet and exercise are not working.  If you smoke, find out from your health care provider how to quit. If you do not use tobacco, do not start.  Lung cancer screening is recommended for adults aged 32-80 years who are at high risk for developing lung cancer because of a history of smoking. A yearly low-dose CT scan of the lungs is  recommended for people who have at least a 30-pack-year history of smoking and are current smokers or have quit within the past 15 years. A pack year of smoking is smoking an average of 1 pack of cigarettes a day for 1 year (for example, a 30-pack-year history of smoking could mean smoking 1 pack a day for 30 years or 2 packs a day for 15 years). Yearly screening should continue until the smoker has stopped smoking for at least 15 years. Yearly screening should be stopped for people who develop a health problem that would prevent them from having lung cancer treatment.  If you choose to drink alcohol, do not have more than 2 drinks per day. One drink is considered to be 12 oz (360 mL) of beer, 5 oz (150 mL) of wine, or 1.5 oz (45 mL) of liquor.  Avoid the use of street drugs. Do not share needles with anyone. Ask for help if you need support or instructions about stopping the use of drugs.  High blood pressure causes heart disease and increases the risk of stroke. High blood pressure is more likely to develop in:  People who have blood pressure in the end of the normal range (100-139/85-89 mm Hg).  People who are overweight or obese.  People who are African American.  If you are 74-48 years of age, have your blood pressure checked every 3-5 years. If you are 13 years of age or older, have your blood pressure checked every year. You should have your blood pressure measured twice--once when you are at a hospital or clinic, and once when you are not at a hospital or clinic. Record the average of the two measurements. To check your blood pressure when you are not at a hospital or clinic, you can use:  An automated blood pressure machine at a pharmacy.  A home blood pressure monitor.  If you are 70-29 years old, ask your health care provider if you should take aspirin to prevent heart disease.  Diabetes screening involves taking a blood sample to check your fasting blood sugar level. This should be  done once every 3 years after age 66 if you are at a normal weight and without risk factors for diabetes. Testing should be considered at a younger age or be carried out more frequently if you are overweight and have at least 1 risk factor for diabetes.  Colorectal cancer can be detected and often prevented. Most routine colorectal cancer screening begins at the age of 103 and continues through age 78. However, your health care provider may recommend screening at an earlier age if you have risk factors for colon cancer. On a yearly basis, your health care provider may provide home test kits to check for hidden blood in the stool. A small camera at the end of a tube may be used to directly examine the colon (sigmoidoscopy or colonoscopy) to detect the earliest forms of colorectal cancer. Talk to your health care provider about this at age 77 when routine screening begins. A direct exam of the colon should be repeated every 5-10 years  through age 38, unless early forms of precancerous polyps or small growths are found.  People who are at an increased risk for hepatitis B should be screened for this virus. You are considered at high risk for hepatitis B if:  You were born in a country where hepatitis B occurs often. Talk with your health care provider about which countries are considered high risk.  Your parents were born in a high-risk country and you have not received a shot to protect against hepatitis B (hepatitis B vaccine).  You have HIV or AIDS.  You use needles to inject street drugs.  You live with, or have sex with, someone who has hepatitis B.  You are a man who has sex with other men (MSM).  You get hemodialysis treatment.  You take certain medicines for conditions like cancer, organ transplantation, and autoimmune conditions.  Hepatitis C blood testing is recommended for all people born from [birth year] through 1967 and any individual with known risk factors for hepatitis C.  Healthy men  should no longer receive prostate-specific antigen (PSA) blood tests as part of routine cancer screening. Talk to your health care provider about prostate cancer screening.  Testicular cancer screening is not recommended for adolescents or adult males who have no symptoms. Screening includes self-exam, a health care provider exam, and other screening tests. Consult with your health care provider about any symptoms you have or any concerns you have about testicular cancer.  Practice safe sex. Use condoms and avoid high-risk sexual practices to reduce the spread of sexually transmitted infections (STIs).  You should be screened for STIs, including gonorrhea and chlamydia if:  You are sexually active and are younger than 24 years.  You are older than 24 years, and your health care provider tells you that you are at risk for this type of infection.  Your sexual activity has changed since you were last screened, and you are at an increased risk for chlamydia or gonorrhea. Ask your health care provider if you are at risk.  If you are at risk of being infected with HIV, it is recommended that you take a prescription medicine daily to prevent HIV infection. This is called pre-exposure prophylaxis (PrEP). You are considered at risk if:  You are a man who has sex with other men (MSM).  You are a heterosexual man who is sexually active with multiple partners.  You take drugs by injection.  You are sexually active with a partner who has HIV.  Talk with your health care provider about whether you are at high risk of being infected with HIV. If you choose to begin PrEP, you should first be tested for HIV. You should then be tested every 3 months for as long as you are taking PrEP.  Use sunscreen. Apply sunscreen liberally and repeatedly throughout the day. You should seek shade when your shadow is shorter than you. Protect yourself by wearing long sleeves, pants, a wide-brimmed hat, and sunglasses year  round whenever you are outdoors.  Tell your health care provider of new moles or changes in moles, especially if there is a change in shape or color. Also, tell your health care provider if a mole is larger than the size of a pencil eraser.  A one-time screening for abdominal aortic aneurysm (AAA) and surgical repair of large AAAs by ultrasound is recommended for men aged 44-75 years who are current or former smokers.  Stay current with your vaccines (immunizations).   This information is not  intended to replace advice given to you by your health care provider. Make sure you discuss any questions you have with your health care provider.   Document Released: 12/06/2007 Document Revised: 06/30/2014 Document Reviewed: 11/04/2010 Elsevier Interactive Patient Education [employer].

## 2015-07-30 NOTE — Progress Notes (Signed)
Patient ID: Vernon Murray, male   DOB: January 04, 1961, 55 y.o.   MRN: [id number]   Subjective:   Vernon Murray is a 55 y.o. male here for a complete physical exam  Interim issues since last visit:  USPSTF grade A and B recommendations Alcohol: social Depression:  Depression screen [facility] 2/9 07/30/2015  Decreased Interest 1  Down, Depressed, Hopeless 0  PHQ - 2 Score 1  no thoughts of self-harm; just decreased interested in things a few weeks ago; now outdoors and feeling better A few years ago, was almost suicidal with depression Hypertension: not controlled today; recheck was much better:  133/86 Obesity: stable; no snacking or eating late at night anymore; exercising Tobacco use: never smoking HIV, hep B, hep C: discussed, hep already checked, HIV politely declined STD testing and prevention (chl/gon/syphilis): politely declined Lipids: check today Glucose: check today Colorectal cancer: not interested in colonoscopy; not interested in Cologuard after discussion Breast cancer: father had breast cancer; he is not interested in BRCA gene testing; does breast exams Lung cancer: n/a Osteoporosis: n/a AAA: n/a Aspirin: not taking baby aspirin Diet: typical [place], depends on the season though, heavier on salads during summer; more soups with winter, homemade soups Exercise:4 times a day  Skin cancer: no worrisome moles,  PSA discussion; patient declined after discussion of USPSTF guidelines  Past Medical History  Diagnosis Date  . Hypertension   . Depression   . Elevated liver enzymes   . Migraine   . Fatty liver   . Obesity    Past Surgical History  Procedure Laterality Date  . Nasal sinus surgery    . Groin mass open biopsy      non cancerous   Family History  Problem Relation Age of Onset  . Hypertension Mother   . Stroke Mother 33    AVM or bleed in the brain x 2  . Hypertension Father   . Stroke Father   . Deep vein thrombosis Father   . Pulmonary  embolism Father   . Pulmonary fibrosis Father   . Cancer Father     breast and prostate cancer  . Heart disease Paternal Grandfather   . COPD Neg Hx   . Diabetes Neg Hx   no known thyroid d/o  Social History  Substance Use Topics  . Smoking status: Never Smoker   . Smokeless tobacco: Never Used  . Alcohol Use: No     Comment: rare   Review of Systems  Constitutional: Negative for fever and chills.  HENT: Positive for hearing loss (does have hearing loss, unchanged, uses aides). Negative for nosebleeds.   Eyes: Negative for visual disturbance.  Cardiovascular: Negative for chest pain.  Gastrointestinal: Negative for blood in stool.  Endocrine: Negative for polydipsia and polyuria.  Genitourinary: Negative for hematuria.  Musculoskeletal: Negative for arthralgias.  Skin:       Derm looked at two spots back of scalp  Allergic/Immunologic: Negative for food allergies.  Neurological: Negative for tremors.  Hematological: Does not bruise/bleed easily.   Objective:   Filed Vitals:   07/30/15 0914 07/30/15 0937  BP: 147/86 133/86  Pulse: 65 68  Temp: 97.8 F (36.6 C)   Height: 5' 9.5" (1.765 m)   Weight: 267 lb (121.11 kg)   SpO2: 98%    Body mass index is 38.88 kg/(m^2). Wt Readings from Last 3 Encounters:  07/30/15 267 lb (121.11 kg)  12/29/14 268 lb (121.564 kg)  12/04/14 266 lb (120.657 kg)   Physical  Exam  Constitutional: He appears well-developed and well-nourished. No distress.  obese  HENT:  Head: Normocephalic and atraumatic.  Nose: Nose normal.  Mouth/Throat: Oropharynx is clear and moist.  Eyes: EOM are normal. No scleral icterus.  Neck: No JVD present. No thyromegaly present.  Cardiovascular: Normal rate, regular rhythm and normal heart sounds.   Pulmonary/Chest: Effort normal and breath sounds normal. No respiratory distress. He has no wheezes. He has no rales.  Abdominal: Soft. Bowel sounds are normal. He exhibits no distension. There is no  tenderness. There is no guarding.  Musculoskeletal: Normal range of motion. He exhibits no edema.  Lymphadenopathy:    He has no cervical adenopathy.  Neurological: He is alert. He displays normal reflexes. He exhibits normal muscle tone. Coordination normal.  Skin: Skin is warm and dry. No rash noted. He is not diaphoretic. No erythema. No pallor.  Keratotic papule base of scalp in hairline; no palmar erythema, no nailbed pallor  Psychiatric: He has a normal mood and affect. His speech is normal and behavior is normal. Judgment and thought content normal. He does not exhibit a depressed mood.    Assessment/Plan:   Problem List Items Addressed This Visit      Cardiovascular and Mediastinum   Hypertension    Not at goal during check-in, but pressures improved with sitting for a short time; DASH guidelines, weight loss recommended        Other   Obesity    Encouraged modest weight loss, see AVS; check TSH      Depression    Check vit D, TSH; consider light therapy during winter months      Relevant Orders   TSH   VITAMIN D 25 Hydroxy (Vit-D Deficiency, Fractures)   Preventative health care - Primary    USPSTF grade A and B recommendations reviewed with patient; age-appropriate recommendations, preventive care, screening tests, etc discussed and encouraged; healthy living encouraged; see AVS for patient education given to patient; he declined colonoscopy, declined Cologuard, declined PSA testing      Relevant Orders   CBC with Differential/Platelet   Comprehensive metabolic panel   Lipid Panel w/o Chol/HDL Ratio      Follow up plan: Return in about 1 year (around 07/29/2016) for complete physical.  Orders Placed This Encounter  Procedures  . TSH  . VITAMIN D 25 Hydroxy (Vit-D Deficiency, Fractures)  . CBC with Differential/Platelet  . Comprehensive metabolic panel  . Lipid Panel w/o Chol/HDL Ratio   An after-visit summary was printed and given to the patient at  [facility].  Please see the patient instructions which may contain other information and recommendations beyond what is mentioned above in the assessment and plan.

## 2015-07-30 NOTE — Assessment & Plan Note (Addendum)
Not at goal during check-in, but pressures improved with sitting for a short time; DASH guidelines, weight loss recommended

## 2015-07-31 LAB — CBC WITH DIFFERENTIAL/PLATELET
BASOS: 1 %
Basophils Absolute: 0 10*3/uL (ref 0.0–0.2)
EOS (ABSOLUTE): 0.2 10*3/uL (ref 0.0–0.4)
EOS: 4 %
HEMATOCRIT: 45.2 % (ref 37.5–51.0)
HEMOGLOBIN: 15.9 g/dL (ref 12.6–17.7)
IMMATURE GRANS (ABS): 0 10*3/uL (ref 0.0–0.1)
Immature Granulocytes: 0 %
LYMPHS: 39 %
Lymphocytes Absolute: 1.7 10*3/uL (ref 0.7–3.1)
MCH: 30.6 pg (ref 26.6–33.0)
MCHC: 35.2 g/dL (ref 31.5–35.7)
MCV: 87 fL (ref 79–97)
MONOCYTES: 10 %
Monocytes Absolute: 0.4 10*3/uL (ref 0.1–0.9)
NEUTROS ABS: 2 10*3/uL (ref 1.4–7.0)
Neutrophils: 46 %
Platelets: 214 10*3/uL (ref 150–379)
RBC: 5.19 x10E6/uL (ref 4.14–5.80)
RDW: 13.1 % (ref 12.3–15.4)
WBC: 4.4 10*3/uL (ref 3.4–10.8)

## 2015-07-31 LAB — COMPREHENSIVE METABOLIC PANEL
A/G RATIO: 1.8 (ref 1.1–2.5)
ALBUMIN: 4.3 g/dL (ref 3.5–5.5)
ALT: 24 IU/L (ref 0–44)
AST: 18 IU/L (ref 0–40)
Alkaline Phosphatase: 58 IU/L (ref 39–117)
BILIRUBIN TOTAL: 0.4 mg/dL (ref 0.0–1.2)
BUN / CREAT RATIO: 17 (ref 9–20)
BUN: 15 mg/dL (ref 6–24)
CALCIUM: 9.9 mg/dL (ref 8.7–10.2)
CHLORIDE: 100 mmol/L (ref 96–106)
CO2: 26 mmol/L (ref 18–29)
Creatinine, Ser: 0.87 mg/dL (ref 0.76–1.27)
GFR, EST AFRICAN AMERICAN: 112 mL/min/{1.73_m2} (ref >59)
GFR, EST NON AFRICAN AMERICAN: 96 mL/min/{1.73_m2} (ref >59)
Globulin, Total: 2.4 g/dL (ref 1.5–4.5)
Glucose: 87 mg/dL (ref 65–99)
POTASSIUM: 5.2 mmol/L (ref 3.5–5.2)
Sodium: 140 mmol/L (ref 134–144)
TOTAL PROTEIN: 6.7 g/dL (ref 6.0–8.5)

## 2015-07-31 LAB — LIPID PANEL W/O CHOL/HDL RATIO
Cholesterol, Total: 156 mg/dL (ref 100–199)
HDL: 42 mg/dL (ref >39)
LDL Calculated: 91 mg/dL (ref 0–99)
Triglycerides: 114 mg/dL (ref 0–149)
VLDL CHOLESTEROL CAL: 23 mg/dL (ref 5–40)

## 2015-07-31 LAB — VITAMIN D 25 HYDROXY (VIT D DEFICIENCY, FRACTURES): Vit D, 25-Hydroxy: 26.9 ng/mL — ABNORMAL LOW (ref 30.0–100.0)

## 2015-07-31 LAB — TSH: TSH: 1.19 u[IU]/mL (ref 0.450–4.500)

## 2015-11-07 ENCOUNTER — Ambulatory Visit: Payer: BLUE CROSS/BLUE SHIELD | Admitting: Family Medicine

## 2015-11-13 ENCOUNTER — Encounter: Payer: Self-pay | Admitting: Family Medicine

## 2015-11-13 ENCOUNTER — Ambulatory Visit (INDEPENDENT_AMBULATORY_CARE_PROVIDER_SITE_OTHER): Payer: BLUE CROSS/BLUE SHIELD | Admitting: Family Medicine

## 2015-11-13 VITALS — BP 143/87 | HR 68 | Temp 97.9°F | Ht 69.5 in | Wt 266.2 lb

## 2015-11-13 DIAGNOSIS — Z23 Encounter for immunization: Secondary | ICD-10-CM

## 2015-11-13 DIAGNOSIS — M545 Low back pain, unspecified: Secondary | ICD-10-CM

## 2015-11-13 NOTE — Patient Instructions (Addendum)
Back Exercises The following exercises strengthen the muscles that help to support the back. They also help to keep the lower back flexible. Doing these exercises can help to prevent back pain or lessen existing pain. If you have back pain or discomfort, try doing these exercises 2-3 times each day or as told by your health care provider. When the pain goes away, do them once each day, but increase the number of times that you repeat the steps for each exercise (do more repetitions). If you do not have back pain or discomfort, do these exercises once each day or as told by your health care provider. EXERCISES Single Knee to Chest Repeat these steps 3-5 times for each leg: 1. Lie on your back on a firm bed or the floor with your legs extended. 2. Bring one knee to your chest. Your other leg should stay extended and in contact with the floor. 3. Hold your knee in place by grabbing your knee or thigh. 4. Pull on your knee until you feel a gentle stretch in your lower back. 5. Hold the stretch for 10-30 seconds. 6. Slowly release and straighten your leg. Pelvic Tilt Repeat these steps 5-10 times: 1. Lie on your back on a firm bed or the floor with your legs extended. 2. Bend your knees so they are pointing toward the ceiling and your feet are flat on the floor. 3. Tighten your lower abdominal muscles to press your lower back against the floor. This motion will tilt your pelvis so your tailbone points up toward the ceiling instead of pointing to your feet or the floor. 4. With gentle tension and even breathing, hold this position for 5-10 seconds. Cat-Cow Repeat these steps until your lower back becomes more flexible: 1. Get into a hands-and-knees position on a firm surface. Keep your hands under your shoulders, and keep your knees under your hips. You may place padding under your knees for comfort. 2. Let your head hang down, and point your tailbone toward the floor so your lower back becomes  rounded like the back of a cat. 3. Hold this position for 5 seconds. 4. Slowly lift your head and point your tailbone up toward the ceiling so your back forms a sagging arch like the back of a cow. 5. Hold this position for 5 seconds. Press-Ups Repeat these steps 5-10 times: 1. Lie on your abdomen (face-down) on the floor. 2. Place your palms near your head, about shoulder-width apart. 3. While you keep your back as relaxed as possible and keep your hips on the floor, slowly straighten your arms to raise the top half of your body and lift your shoulders. Do not use your back muscles to raise your upper torso. You may adjust the placement of your hands to make yourself more comfortable. 4. Hold this position for 5 seconds while you keep your back relaxed. 5. Slowly return to lying flat on the floor. Bridges Repeat these steps 10 times: 1. Lie on your back on a firm surface. 2. Bend your knees so they are pointing toward the ceiling and your feet are flat on the floor. 3. Tighten your buttocks muscles and lift your buttocks off of the floor until your waist is at almost the same height as your knees. You should feel the muscles working in your buttocks and the back of your thighs. If you do not feel these muscles, slide your feet 1-2 inches farther away from your buttocks. 4. Hold this position for 3-5   seconds. 5. Slowly lower your hips to the starting position, and allow your buttocks muscles to relax completely. If this exercise is too easy, try doing it with your arms crossed over your chest. Abdominal Crunches Repeat these steps 5-10 times: 1. Lie on your back on a firm bed or the floor with your legs extended. 2. Bend your knees so they are pointing toward the ceiling and your feet are flat on the floor. 3. Cross your arms over your chest. 4. Tip your chin slightly toward your chest without bending your neck. 5. Tighten your abdominal muscles and slowly raise your trunk (torso) high  enough to lift your shoulder blades a tiny bit off of the floor. Avoid raising your torso higher than that, because it can put too much stress on your low back and it does not help to strengthen your abdominal muscles. 6. Slowly return to your starting position. Back Lifts Repeat these steps 5-10 times: 1. Lie on your abdomen (face-down) with your arms at your sides, and rest your forehead on the floor. 2. Tighten the muscles in your legs and your buttocks. 3. Slowly lift your chest off of the floor while you keep your hips pressed to the floor. Keep the back of your head in line with the curve in your back. Your eyes should be looking at the floor. 4. Hold this position for 3-5 seconds. 5. Slowly return to your starting position. SEEK MEDICAL CARE IF:  Your back pain or discomfort gets much worse when you do an exercise.  Your back pain or discomfort does not lessen within 2 hours after you exercise. If you have any of these problems, stop doing these exercises right away. Do not do them again unless your health care provider says that you can. SEEK IMMEDIATE MEDICAL CARE IF:  You develop sudden, severe back pain. If this happens, stop doing the exercises right away. Do not do them again unless your health care provider says that you can.   This information is not intended to replace advice given to you by your health care provider. Make sure you discuss any questions you have with your health care provider.   Document Released: 07/17/2004 Document Revised: 02/28/2015 Document Reviewed: 08/03/2014 Elsevier Interactive Patient Education 2016 Else[employer] Varicella-Zoster Virus Vaccine Live injection What is this medicine? VARICELLA VIRUS VACCINE (var uh SEL uh VAHY ruhs vak SEEN) is used to prevent infections of chickenpox. HERPES ZOSTER VIRUS VACCINE (HUR peez ZOS ter vahy ruhs vak SEEN) is used to prevent shingles in adults 57 years old and over. This vaccine is not used to treat  shingles or nerve pain from shingles. These medicines may be used for other purposes; ask your health care provider or pharmacist if you have questions. This medicine may be used for other purposes; ask your health care provider or pharmacist if you have questions. What should I tell my health care provider before I take this medicine? They need to know if you have any of the following conditions: -blood disorders or disease -cancer like leukemia or lymphoma -immune system problems or therapy -infection with fever -recent immune globulin therapy -tuberculosis -an unusual or allergic reaction to vaccines, neomycin, gelatin, other medicines, foods, dyes, or preservatives -pregnant or trying to get pregnant -breast-feeding How should I use this medicine? These vaccines are for injection under the skin. They are given by a health care professional. A copy of Vaccine Information Statements will be given before each varicella virus vaccination. Read this  sheet carefully each time. The sheet may change frequently. A Vaccine Information Statement is not given before the herpes zoster virus vaccine. Talk to your pediatrician regarding the use of the varicella virus vaccine in children. While this drug may be prescribed for children as young as 65 months of age for selected conditions, precautions do apply. The herpes zoster virus vaccine is not approved in children. Overdosage: If you think you have taken too much of this medicine contact a poison control center or emergency room at once. NOTE: This medicine is only for you. Do not share this medicine with others. What if I miss a dose? Keep appointments for follow-up (booster) doses of varicella virus vaccine as directed. It is important not to miss your dose. Call your doctor or health care professional if you are unable to keep an appointment. Follow-up (booster) doses are not needed for the herpes zoster virus vaccine. What may interact with this  medicine? Do not take these medicines with any of the following medications: -adalimumab -anakinra -etanercept -infliximab -medicines that suppress your immune system -medicines to treat cancer These medicines may also interact with the following medications: -aspirin and aspirin-like medicines (varicella virus vaccine only) -blood transfusions (varicella virus vaccine only) -immunoglobulins (varicella virus vaccine only) -steroid medicines like prednisone or cortisone This list may not describe all possible interactions. Give your health care provider a list of all the medicines, herbs, non-prescription drugs, or dietary supplements you use. Also tell them if you smoke, drink alcohol, or use illegal drugs. Some items may interact with your medicine. What should I watch for while using this medicine? Visit your doctor for regular check ups. These vaccines, like all vaccines, may not fully protect everyone. After receiving these vaccines it may be possible to pass chickenpox infection to others. For up to 6 weeks, avoid people with immune system problems, pregnant women who have not had chickenpox, newborns of women who have not had chickenpox, and all newborns born at less than 28 weeks of pregnancy. Talk to your doctor for more information. Do not become pregnant for 3 months after taking these vaccines. Women should inform their doctor if they wish to become pregnant or think they might be pregnant. There is a potential for serious side effects to an unborn child. Talk to your health care professional or pharmacist for more information. What side effects may I notice from receiving this medicine? Side effects that you should report to your doctor or health care professional as soon as possible: -allergic reactions like skin rash, itching or hives, swelling of the face, lips, or tongue -breathing problems -extreme changes in behavior -feeling faint or lightheaded, falls -fever over 102  degrees F -pain, tingling, numbness in the hands or feet -redness, blistering, peeling or loosening of the skin, including inside the mouth -seizures -unusually weak or tired Side effects that usually do not require medical attention (report to your doctor or health care professional if they continue or are bothersome): -aches or pains -chickenpox-like rash -diarrhea -headache -low-grade fever under 102 degrees F -loss of appetite -nausea, vomiting -redness, pain, swelling at site where injected -sleepy -trouble sleeping This list may not describe all possible side effects. Call your doctor for medical advice about side effects. You may report side effects to FDA at 1-800-FDA-1088. Where should I keep my medicine? These drugs are given in a hospital or clinic and will not be stored at home. NOTE: This sheet is a summary. It may not cover all possible information.  If you have questions about this medicine, talk to your doctor, pharmacist, or health care provider.    2016, Elsevier/Gold Standard. (2013-02-11 14:24:35)

## 2015-11-13 NOTE — Progress Notes (Signed)
BP 143/87 mmHg  Pulse 68  Temp(Src) 97.9 F (36.6 C)  Ht 5' 9.5" (1.765 m)  Wt 266 lb 3.2 oz (120.748 kg)  BMI 38.76 kg/m2  SpO2 97%   Subjective:    Patient ID: Vernon Murray, male    DOB: 1960/12/09, 55 y.o.   MRN: [id number]  HPI: Vernon Murray is a 55 y.o. male  Chief Complaint  Patient presents with  . Back Pain    Lower  . Herpes Zoster    Wants shingles vaccine   BACK PAIN Duration: Several months- pulled it out in January and in February, working with Systems analyst. Takes 3-4 months to get better, would like to see PT to get it taken care of so it won't hurt as much.  Mechanism of injury: lifting Location: bilateral and low back Onset: sudden Severity: 2/10 Quality: sore, throbbing, tight Frequency: constant Radiation: none Aggravating factors: leaning, certain positions Alleviating factors: chiropractor, personal trainer Status: better Treatments attempted:  chiropractor, personal trainer, NSAID, heat, ice Status: better Relief with NSAIDs?: mild Nighttime pain:  yes Paresthesias / decreased sensation:  no Bowel / bladder incontinence:  no Fevers:  no Dysuria / urinary frequency:  no  Relevant past medical, surgical, family and social history reviewed and updated as indicated. Interim medical history since our last visit reviewed. Allergies and medications reviewed and updated.  Review of Systems  Constitutional: Negative.   Respiratory: Negative.   Cardiovascular: Negative.   Musculoskeletal: Positive for myalgias and back pain. Negative for joint swelling, arthralgias, gait problem, neck pain and neck stiffness.  Psychiatric/Behavioral: Negative.     Per HPI unless specifically indicated above     Objective:    BP 143/87 mmHg  Pulse 68  Temp(Src) 97.9 F (36.6 C)  Ht 5' 9.5" (1.765 m)  Wt 266 lb 3.2 oz (120.748 kg)  BMI 38.76 kg/m2  SpO2 97%  Wt Readings from Last 3 Encounters:  11/13/15 266 lb 3.2 oz (120.748 kg)  07/30/15 267  lb (121.11 kg)  12/29/14 268 lb (121.564 kg)    Physical Exam  Constitutional: He is oriented to person, place, and time. He appears well-developed and well-nourished. No distress.  HENT:  Head: Normocephalic and atraumatic.  Right Ear: Hearing normal.  Left Ear: Hearing normal.  Nose: Nose normal.  Eyes: Conjunctivae and lids are normal. Right eye exhibits no discharge. Left eye exhibits no discharge. No scleral icterus.  Pulmonary/Chest: Effort normal. No respiratory distress.  Neurological: He is alert and oriented to person, place, and time.  Skin: Skin is warm, dry and intact. No rash noted. No erythema. No pallor.  Psychiatric: He has a normal mood and affect. His speech is normal and behavior is normal. Judgment and thought content normal. Cognition and memory are normal.  Nursing note and vitals reviewed.  Back Exam:    Inspection:  Normal spinal curvature.  No deformity, ecchymosis, erythema, or lesions - hypertonic paraspinals on the R in the lumbar    Palpation:     Midline spinal tenderness: no      Paralumbar tenderness: yes Right     Parathoracic tenderness: no      Buttocks tenderness: no     Range of Motion:      Flexion: Fingers to Knees     Extension:Decreased     Lateral bending:Decreased    Rotation:Decreased    Neuro Exam:Lower extremity DTRs normal & symmetric.  Strength and sensation intact.      Special Tests:  Straight leg raise:negative- but very tight hamstrings     Results for orders placed or performed in visit on 07/30/15  TSH  Result Value Ref Range   TSH 1.190 0.450 - 4.500 uIU/mL  VITAMIN D 25 Hydroxy (Vit-D Deficiency, Fractures)  Result Value Ref Range   Vit D, 25-Hydroxy 26.9 (L) 30.0 - 100.0 ng/mL  CBC with Differential/Platelet  Result Value Ref Range   WBC 4.4 3.4 - 10.8 x10E3/uL   RBC 5.19 4.14 - 5.80 x10E6/uL   Hemoglobin 15.9 12.6 - 17.7 g/dL   Hematocrit 16.145.2 09.637.5 - 51.0 %   MCV 87 79 - 97 fL   MCH 30.6 26.6 - 33.0 pg    MCHC 35.2 31.5 - 35.7 g/dL   RDW 04.513.1 40.912.3 - 81.115.4 %   Platelets 214 150 - 379 x10E3/uL   Neutrophils 46 %   Lymphs 39 %   Monocytes 10 %   Eos 4 %   Basos 1 %   Neutrophils Absolute 2.0 1.4 - 7.0 x10E3/uL   Lymphocytes Absolute 1.7 0.7 - 3.1 x10E3/uL   Monocytes Absolute 0.4 0.1 - 0.9 x10E3/uL   EOS (ABSOLUTE) 0.2 0.0 - 0.4 x10E3/uL   Basophils Absolute 0.0 0.0 - 0.2 x10E3/uL   Immature Granulocytes 0 %   Immature Grans (Abs) 0.0 0.0 - 0.1 x10E3/uL  Comprehensive metabolic panel  Result Value Ref Range   Glucose 87 65 - 99 mg/dL   BUN 15 6 - 24 mg/dL   Creatinine, Ser 9.140.87 0.76 - 1.27 mg/dL   GFR calc non Af Amer 96 >59 mL/min/1.73   GFR calc Af Amer 112 >59 mL/min/1.73   BUN/Creatinine Ratio 17 9 - 20   Sodium 140 134 - 144 mmol/L   Potassium 5.2 3.5 - 5.2 mmol/L   Chloride 100 96 - 106 mmol/L   CO2 26 18 - 29 mmol/L   Calcium 9.9 8.7 - 10.2 mg/dL   Total Protein 6.7 6.0 - 8.5 g/dL   Albumin 4.3 3.5 - 5.5 g/dL   Globulin, Total 2.4 1.5 - 4.5 g/dL   Albumin/Globulin Ratio 1.8 1.1 - 2.5   Bilirubin Total 0.4 0.0 - 1.2 mg/dL   Alkaline Phosphatase 58 39 - 117 IU/L   AST 18 0 - 40 IU/L   ALT 24 0 - 44 IU/L  Lipid Panel w/o Chol/HDL Ratio  Result Value Ref Range   Cholesterol, Total 156 100 - 199 mg/dL   Triglycerides 782114 0 - 149 mg/dL   HDL 42 >95>39 mg/dL   VLDL Cholesterol Cal 23 5 - 40 mg/dL   LDL Calculated 91 0 - 99 mg/dL      Assessment & Plan:   Problem List Items Addressed This Visit    None    Visit Diagnoses    Bilateral low back pain without sciatica    -  Primary    Will start PT- Rx given today. Call with any concerns or if not getting better. Exercises given today.    Immunization due        Zoster given today.     Relevant Orders    Varicella-zoster vaccine subcutaneous        Follow up plan: Return August, for Follow up BP.

## 2015-11-14 DIAGNOSIS — M545 Low back pain: Secondary | ICD-10-CM | POA: Diagnosis not present

## 2015-11-20 DIAGNOSIS — M545 Low back pain: Secondary | ICD-10-CM | POA: Diagnosis not present

## 2015-11-22 DIAGNOSIS — M545 Low back pain: Secondary | ICD-10-CM | POA: Diagnosis not present

## 2015-11-27 DIAGNOSIS — M545 Low back pain: Secondary | ICD-10-CM | POA: Diagnosis not present

## 2016-02-14 ENCOUNTER — Ambulatory Visit: Payer: BLUE CROSS/BLUE SHIELD | Admitting: Family Medicine

## 2016-03-05 ENCOUNTER — Ambulatory Visit (INDEPENDENT_AMBULATORY_CARE_PROVIDER_SITE_OTHER): Payer: BLUE CROSS/BLUE SHIELD

## 2016-03-05 DIAGNOSIS — Z23 Encounter for immunization: Secondary | ICD-10-CM

## 2016-07-30 ENCOUNTER — Encounter: Payer: BLUE CROSS/BLUE SHIELD | Admitting: Family Medicine

## 2016-08-13 ENCOUNTER — Encounter: Payer: Self-pay | Admitting: Family Medicine

## 2016-08-13 ENCOUNTER — Ambulatory Visit (INDEPENDENT_AMBULATORY_CARE_PROVIDER_SITE_OTHER): Payer: BLUE CROSS/BLUE SHIELD | Admitting: Family Medicine

## 2016-08-13 VITALS — BP 126/78 | HR 73 | Ht 70.47 in | Wt 265.0 lb

## 2016-08-13 DIAGNOSIS — Z0000 Encounter for general adult medical examination without abnormal findings: Secondary | ICD-10-CM

## 2016-08-13 DIAGNOSIS — M5442 Lumbago with sciatica, left side: Secondary | ICD-10-CM

## 2016-08-13 DIAGNOSIS — Z114 Encounter for screening for human immunodeficiency virus [HIV]: Secondary | ICD-10-CM

## 2016-08-13 DIAGNOSIS — I10 Essential (primary) hypertension: Secondary | ICD-10-CM | POA: Diagnosis not present

## 2016-08-13 DIAGNOSIS — M549 Dorsalgia, unspecified: Secondary | ICD-10-CM | POA: Insufficient documentation

## 2016-08-13 DIAGNOSIS — M5441 Lumbago with sciatica, right side: Secondary | ICD-10-CM

## 2016-08-13 DIAGNOSIS — Z125 Encounter for screening for malignant neoplasm of prostate: Secondary | ICD-10-CM

## 2016-08-13 DIAGNOSIS — F325 Major depressive disorder, single episode, in full remission: Secondary | ICD-10-CM

## 2016-08-13 DIAGNOSIS — Z13220 Encounter for screening for lipoid disorders: Secondary | ICD-10-CM | POA: Diagnosis not present

## 2016-08-13 DIAGNOSIS — G8929 Other chronic pain: Secondary | ICD-10-CM | POA: Diagnosis not present

## 2016-08-13 DIAGNOSIS — Z1329 Encounter for screening for other suspected endocrine disorder: Secondary | ICD-10-CM

## 2016-08-13 LAB — URINALYSIS, ROUTINE W REFLEX MICROSCOPIC
BILIRUBIN UA: NEGATIVE
GLUCOSE, UA: NEGATIVE
Ketones, UA: NEGATIVE
Leukocytes, UA: NEGATIVE
NITRITE UA: NEGATIVE
PH UA: 6 (ref 5.0–7.5)
Protein, UA: NEGATIVE
Specific Gravity, UA: <1.005 — ABNORMAL LOW (ref 1.005–1.030)
UUROB: 0.2 mg/dL (ref 0.2–1.0)

## 2016-08-13 NOTE — Progress Notes (Signed)
BP 126/78   Pulse 73   Ht 5' 10.47" (1.79 m)   Wt 265 lb (120.2 kg)   SpO2 99%   BMI 37.52 kg/m    Subjective:    Patient ID: Vernon Murray, male    DOB: [birth year], 56 y.o.   MRN: [id number]  HPI: Vernon Murray is a 56 y.o. male  Chief Complaint  Patient presents with  . Annual Exam  . Back Pain   Patient with ongoing back pain with suspected disc. As long as he does physical therapy and proper lifting technique is able to manage pain okay. Difficulties or care for his father who falls and needs to be picked up and lifted. Depression being controlled especially with vitamin D and some lifestyle choices. Blood pressure dietary control also. Weight issues remain a concern.  Relevant past medical, surgical, family and social history reviewed and updated as indicated. Interim medical history since our last visit reviewed. Allergies and medications reviewed and updated.  Review of Systems  Constitutional: Negative.   HENT: Negative.   Eyes: Negative.   Respiratory: Negative.   Cardiovascular: Negative.   Gastrointestinal: Negative.   Endocrine: Negative.   Genitourinary: Negative.   Musculoskeletal: Negative.   Skin: Negative.   Allergic/Immunologic: Negative.   Neurological: Negative.   Hematological: Negative.   Psychiatric/Behavioral: Negative.     Per HPI unless specifically indicated above     Objective:    BP 126/78   Pulse 73   Ht 5' 10.47" (1.79 m)   Wt 265 lb (120.2 kg)   SpO2 99%   BMI 37.52 kg/m   Wt Readings from Last 3 Encounters:  08/13/16 265 lb (120.2 kg)  11/13/15 266 lb 3.2 oz (120.7 kg)  07/30/15 267 lb (121.1 kg)    Physical Exam  Constitutional: He is oriented to person, place, and time. He appears well-developed and well-nourished.  HENT:  Head: Normocephalic and atraumatic.  Right Ear: External ear normal.  Left Ear: External ear normal.  Eyes: Conjunctivae and EOM are normal. Pupils are equal, round, and reactive to  light.  Neck: Normal range of motion. Neck supple.  Cardiovascular: Normal rate, regular rhythm, normal heart sounds and intact distal pulses.   Pulmonary/Chest: Effort normal and breath sounds normal.  Abdominal: Soft. Bowel sounds are normal. There is no splenomegaly or hepatomegaly.  Genitourinary: Rectum normal, prostate normal and penis normal.  Genitourinary Comments: Left inguinal swelling which is permanent after hematoma evacuation years ago.  Musculoskeletal: Normal range of motion.  Neurological: He is alert and oriented to person, place, and time. He has normal reflexes.  Skin: No rash noted. No erythema.  Psychiatric: He has a normal mood and affect. His behavior is normal. Judgment and thought content normal.    Results for orders placed or performed in visit on 07/30/15  TSH  Result Value Ref Range   TSH 1.190 0.450 - 4.500 uIU/mL  VITAMIN D 25 Hydroxy (Vit-D Deficiency, Fractures)  Result Value Ref Range   Vit D, 25-Hydroxy 26.9 (L) 30.0 - 100.0 ng/mL  CBC with Differential/Platelet  Result Value Ref Range   WBC 4.4 3.4 - 10.8 x10E3/uL   RBC 5.19 4.14 - 5.80 x10E6/uL   Hemoglobin 15.9 12.6 - 17.7 g/dL   Hematocrit 78.2 95.6 - 51.0 %   MCV 87 79 - 97 fL   MCH 30.6 26.6 - 33.0 pg   MCHC 35.2 31.5 - 35.7 g/dL   RDW 21.3 08.6 - 57.8 %  Platelets 214 150 - 379 x10E3/uL   Neutrophils 46 %   Lymphs 39 %   Monocytes 10 %   Eos 4 %   Basos 1 %   Neutrophils Absolute 2.0 1.4 - 7.0 x10E3/uL   Lymphocytes Absolute 1.7 0.7 - 3.1 x10E3/uL   Monocytes Absolute 0.4 0.1 - 0.9 x10E3/uL   EOS (ABSOLUTE) 0.2 0.0 - 0.4 x10E3/uL   Basophils Absolute 0.0 0.0 - 0.2 x10E3/uL   Immature Granulocytes 0 %   Immature Grans (Abs) 0.0 0.0 - 0.1 x10E3/uL  Comprehensive metabolic panel  Result Value Ref Range   Glucose 87 65 - 99 mg/dL   BUN 15 6 - 24 mg/dL   Creatinine, Ser 7.250.87 0.76 - 1.27 mg/dL   GFR calc non Af Amer 96 >59 mL/min/1.73   GFR calc Af Amer 112 >59 mL/min/1.73    BUN/Creatinine Ratio 17 9 - 20   Sodium 140 134 - 144 mmol/L   Potassium 5.2 3.5 - 5.2 mmol/L   Chloride 100 96 - 106 mmol/L   CO2 26 18 - 29 mmol/L   Calcium 9.9 8.7 - 10.2 mg/dL   Total Protein 6.7 6.0 - 8.5 g/dL   Albumin 4.3 3.5 - 5.5 g/dL   Globulin, Total 2.4 1.5 - 4.5 g/dL   Albumin/Globulin Ratio 1.8 1.1 - 2.5   Bilirubin Total 0.4 0.0 - 1.2 mg/dL   Alkaline Phosphatase 58 39 - 117 IU/L   AST 18 0 - 40 IU/L   ALT 24 0 - 44 IU/L  Lipid Panel w/o Chol/HDL Ratio  Result Value Ref Range   Cholesterol, Total 156 100 - 199 mg/dL   Triglycerides 366114 0 - 149 mg/dL   HDL 42 >44>39 mg/dL   VLDL Cholesterol Cal 23 5 - 40 mg/dL   LDL Calculated 91 0 - 99 mg/dL      Assessment & Plan:   Problem List Items Addressed This Visit      Cardiovascular and Mediastinum   Hypertension    Diet controled        Other   Depression    Lifestyle controled      Back pain    Cont back care and exercises       Other Visit Diagnoses    Annual physical exam    -  Primary   Encounter for screening for HIV       Screening cholesterol level       Prostate cancer screening       Thyroid disorder screen           Follow up plan: Return in about 1 year (around 08/13/2017) for Physical Exam.

## 2016-08-13 NOTE — Assessment & Plan Note (Signed)
Lifestyle controled

## 2016-08-13 NOTE — Assessment & Plan Note (Signed)
Cont back care and exercises

## 2016-08-13 NOTE — Assessment & Plan Note (Signed)
Diet controled 

## 2016-08-14 LAB — LIPID PANEL
Chol/HDL Ratio: 4.2 (ref 0.0–5.0)
Cholesterol, Total: 181 mg/dL (ref 100–199)
HDL: 43 mg/dL (ref >39)
LDL CALC: 113 — AB (ref 0–99)
Triglycerides: 124 mg/dL (ref 0–149)
VLDL CHOLESTEROL CAL: 25 (ref 5–40)

## 2016-08-14 LAB — COMPREHENSIVE METABOLIC PANEL
ALBUMIN: 4.5 g/dL (ref 3.5–5.5)
ALT: 26 IU/L (ref 0–44)
AST: 21 IU/L (ref 0–40)
Albumin/Globulin Ratio: 1.9 (ref 1.2–2.2)
Alkaline Phosphatase: 52 IU/L (ref 39–117)
BILIRUBIN TOTAL: 0.5 mg/dL (ref 0.0–1.2)
BUN / CREAT RATIO: 20 (ref 9–20)
BUN: 17 mg/dL (ref 6–24)
CHLORIDE: 98 mmol/L (ref 96–106)
CO2: 20 mmol/L (ref 18–29)
Calcium: 9.5 mg/dL (ref 8.7–10.2)
Creatinine, Ser: 0.87 mg/dL (ref 0.76–1.27)
GFR calc non Af Amer: 96 (ref >59)
GFR, EST AFRICAN AMERICAN: 111 (ref >59)
GLOBULIN, TOTAL: 2.4 (ref 1.5–4.5)
GLUCOSE: 86 mg/dL (ref 65–99)
Potassium: 4.5 mmol/L (ref 3.5–5.2)
SODIUM: 138 mmol/L (ref 134–144)
TOTAL PROTEIN: 6.9 g/dL (ref 6.0–8.5)

## 2016-08-14 LAB — CBC WITH DIFFERENTIAL/PLATELET
BASOS ABS: 0 10*3/uL (ref 0.0–0.2)
BASOS: 0 %
EOS (ABSOLUTE): 0.2 10*3/uL (ref 0.0–0.4)
EOS: 4 %
HEMATOCRIT: 44.9 % (ref 37.5–51.0)
HEMOGLOBIN: 15.4 g/dL (ref 13.0–17.7)
IMMATURE GRANS (ABS): 0 10*3/uL (ref 0.0–0.1)
Immature Granulocytes: 0 %
LYMPHS ABS: 1.9 10*3/uL (ref 0.7–3.1)
LYMPHS: 39 %
MCH: 30.2 pg (ref 26.6–33.0)
MCHC: 34.3 g/dL (ref 31.5–35.7)
MCV: 88 fL (ref 79–97)
MONOCYTES: 12 %
Monocytes Absolute: 0.6 10*3/uL (ref 0.1–0.9)
NEUTROS ABS: 2.2 10*3/uL (ref 1.4–7.0)
Neutrophils: 45 %
Platelets: 206 10*3/uL (ref 150–379)
RBC: 5.1 x10E6/uL (ref 4.14–5.80)
RDW: 13.8 % (ref 12.3–15.4)
WBC: 4.8 10*3/uL (ref 3.4–10.8)

## 2016-08-14 LAB — PSA: PROSTATE SPECIFIC AG, SERUM: 0.9 ng/mL (ref 0.0–4.0)

## 2016-08-14 LAB — TSH: TSH: 1.47 u[IU]/mL (ref 0.450–4.500)

## 2016-08-18 ENCOUNTER — Encounter: Payer: Self-pay | Admitting: Family Medicine

## 2017-04-02 ENCOUNTER — Ambulatory Visit (INDEPENDENT_AMBULATORY_CARE_PROVIDER_SITE_OTHER): Payer: BLUE CROSS/BLUE SHIELD

## 2017-04-02 DIAGNOSIS — Z23 Encounter for immunization: Secondary | ICD-10-CM

## 2017-04-02 NOTE — Patient Instructions (Signed)

## 2017-08-18 ENCOUNTER — Ambulatory Visit (INDEPENDENT_AMBULATORY_CARE_PROVIDER_SITE_OTHER): Payer: BLUE CROSS/BLUE SHIELD | Admitting: Family Medicine

## 2017-08-18 ENCOUNTER — Encounter: Payer: Self-pay | Admitting: Family Medicine

## 2017-08-18 VITALS — BP 133/80 | HR 67 | Temp 97.8°F | Ht 68.6 in | Wt 250.2 lb

## 2017-08-18 DIAGNOSIS — Z0000 Encounter for general adult medical examination without abnormal findings: Secondary | ICD-10-CM

## 2017-08-18 DIAGNOSIS — I10 Essential (primary) hypertension: Secondary | ICD-10-CM | POA: Diagnosis not present

## 2017-08-18 DIAGNOSIS — M5442 Lumbago with sciatica, left side: Secondary | ICD-10-CM | POA: Diagnosis not present

## 2017-08-18 DIAGNOSIS — G8929 Other chronic pain: Secondary | ICD-10-CM

## 2017-08-18 DIAGNOSIS — M5441 Lumbago with sciatica, right side: Secondary | ICD-10-CM | POA: Diagnosis not present

## 2017-08-18 DIAGNOSIS — Z0001 Encounter for general adult medical examination with abnormal findings: Secondary | ICD-10-CM | POA: Diagnosis not present

## 2017-08-18 LAB — MICROSCOPIC EXAMINATION
Bacteria, UA: NONE SEEN
WBC UA: NONE SEEN /HPF (ref 0–?)

## 2017-08-18 LAB — MICROALBUMIN, URINE WAIVED
CREATININE, URINE WAIVED: 100 mg/dL (ref 10–300)
MICROALB, UR WAIVED: 10 mg/L (ref 0–19)
Microalb/Creat Ratio: <30 mg/g (ref <30)

## 2017-08-18 LAB — UA/M W/RFLX CULTURE, ROUTINE
Bilirubin, UA: NEGATIVE
Glucose, UA: NEGATIVE
Ketones, UA: NEGATIVE
Leukocytes, UA: NEGATIVE
Nitrite, UA: NEGATIVE
PH UA: 6 (ref 5.0–7.5)
PROTEIN UA: NEGATIVE
Specific Gravity, UA: 1.01 (ref 1.005–1.030)
UUROB: 0.2 mg/dL (ref 0.2–1.0)

## 2017-08-18 NOTE — Assessment & Plan Note (Signed)
BP under good control off medicine. Will check microalbumin. Continue to monitor.

## 2017-08-18 NOTE — Assessment & Plan Note (Signed)
Acting up for the past couple of months. Would like to get back into PT. Referral generated today and given to patient.

## 2017-08-18 NOTE — Progress Notes (Signed)
BP 133/80 (BP Location: Left Arm, Patient Position: Sitting, Cuff Size: Large)   Pulse 67   Temp 97.8 F (36.6 C)   Ht 5' 8.6" (1.742 m)   Wt 250 lb 4 oz (113.5 kg)   SpO2 98%   BMI 37.39 kg/m    Subjective:    Patient ID: Vernon Murray, male    DOB: [birth year], 57 y.o.   MRN: [id number]  HPI: Vernon Murray is a 57 y.o. male presenting on 08/18/2017 for comprehensive medical examination. Current medical complaints include:  Lower back has been hurting. Went through a round of PT and continues to do the exercises, about 75% better, but not quite there. He is seeing a chiropractor who sees some muscle spasm. Would like to get back into PT to see if it could help some more. Pain is mainly tight. It is chronic. No radiation down his legs. Hasn't had it go out since August. No numbness or tingling, no weakness.   Interim Problems from his last visit: no  Depression Screen done today and results listed below:  [facility] 08/18/2017 08/13/2016 07/30/2015  Decreased Interest 1 0 1  Down, Depressed, Hopeless 1 0 0  PHQ - 2 Score 2 0 1  Altered sleeping 1 - -  Tired, decreased energy 0 - -  Change in appetite 0 - -  Feeling bad or failure about yourself  1 - -  Trouble concentrating 1 - -  Moving slowly or fidgety/restless 0 - -  Suicidal thoughts 0 - -  PHQ-9 Score 5 - -  Difficult doing work/chores Somewhat difficult - -    Past Medical History:  Past Medical History:  Diagnosis Date  . Depression   . Elevated liver enzymes   . Fatty liver   . Hypertension   . Migraine   . Obesity     Surgical History:  Past Surgical History:  Procedure Laterality Date  . GROIN MASS OPEN BIOPSY     non cancerous  . NASAL SINUS SURGERY      Medications:  Current Outpatient Medications on File Prior to Visit  Medication Sig  . cholecalciferol (VITAMIN D) 1000 units tablet Take 1,000 Units by mouth daily.   No current facility-administered medications on file prior  to visit.     Allergies:  Allergies  Allergen Reactions  . Sulfur   . Valium [Diazepam]     Social History:  Social History   Socioeconomic History  . Marital status: Married    Spouse name: Not on file  . Number of children: Not on file  . Years of education: Not on file  . Highest education level: Not on file  Social Needs  . Financial resource strain: Not on file  . Food insecurity - worry: Not on file  . Food insecurity - inability: Not on file  . Transportation needs - medical: Not on file  . Transportation needs - non-medical: Not on file  Occupational History  . Not on file  Tobacco Use  . Smoking status: Never Smoker  . Smokeless tobacco: Never Used  Substance and Sexual Activity  . Alcohol use: Yes    Alcohol/week: 1.2 oz    Types: 2 Cans of beer per week  . Drug use: No  . Sexual activity: Yes  Other Topics Concern  . Not on file  Social History Narrative  . Not on file   Social History   Tobacco Use  Smoking Status Never Smoker  Smokeless Tobacco Never Used   Social History   Substance and Sexual Activity  Alcohol Use Yes  . Alcohol/week: 1.2 oz  . Types: 2 Cans of beer per week    Family History:  Family History  Problem Relation Age of Onset  . Hypertension Mother   . Stroke Mother 48       AVM or bleed in the brain x 2  . Hypertension Father   . Stroke Father   . Deep vein thrombosis Father   . Pulmonary embolism Father   . Pulmonary fibrosis Father   . Cancer Father        breast and prostate cancer  . Heart disease Paternal Grandfather   . COPD Neg Hx   . Diabetes Neg Hx     Past medical history, surgical history, medications, allergies, family history and social history reviewed with patient today and changes made to appropriate areas of the chart.   Review of Systems  Constitutional: Positive for diaphoresis. Negative for chills, fever, malaise/fatigue and weight loss.  HENT: Positive for hearing loss. Negative for  congestion, ear discharge, ear pain, nosebleeds, sinus pain, sore throat and tinnitus.   Eyes: Negative.   Respiratory: Negative.  Negative for stridor.   Cardiovascular: Negative.   Gastrointestinal: Negative.   Genitourinary: Negative.   Musculoskeletal: Positive for back pain and myalgias. Negative for falls, joint pain and neck pain.  Skin: Negative.   Neurological: Negative.  Negative for weakness.  Endo/Heme/Allergies: Positive for environmental allergies. Negative for polydipsia. Bruises/bleeds easily.    All other ROS negative except what is listed above and in the HPI.      Objective:    BP 133/80 (BP Location: Left Arm, Patient Position: Sitting, Cuff Size: Large)   Pulse 67   Temp 97.8 F (36.6 C)   Ht 5' 8.6" (1.742 m)   Wt 250 lb 4 oz (113.5 kg)   SpO2 98%   BMI 37.39 kg/m   Wt Readings from Last 3 Encounters:  08/18/17 250 lb 4 oz (113.5 kg)  08/13/16 265 lb (120.2 kg)  11/13/15 266 lb 3.2 oz (120.7 kg)    Physical Exam  Constitutional: He is oriented to person, place, and time. He appears well-developed and well-nourished. No distress.  HENT:  Head: Normocephalic and atraumatic.  Right Ear: Hearing, tympanic membrane, external ear and ear canal normal.  Left Ear: Hearing, tympanic membrane, external ear and ear canal normal.  Nose: Nose normal.  Mouth/Throat: Uvula is midline, oropharynx is clear and moist and mucous membranes are normal. No oropharyngeal exudate.  Eyes: Conjunctivae, EOM and lids are normal. Pupils are equal, round, and reactive to light. Right eye exhibits no discharge. Left eye exhibits no discharge. No scleral icterus.  Neck: Normal range of motion. Neck supple. No JVD present. No tracheal deviation present. No thyromegaly present.  Cardiovascular: Normal rate, regular rhythm, normal heart sounds and intact distal pulses. Exam reveals no gallop and no friction rub.  No murmur heard. Pulmonary/Chest: Effort normal and breath sounds normal.  No stridor. No respiratory distress. He has no wheezes. He has no rales. He exhibits no tenderness.  Abdominal: Soft. Bowel sounds are normal. He exhibits no distension and no mass. There is no tenderness. There is no rebound and no guarding. Hernia confirmed negative in the right inguinal area and confirmed negative in the left inguinal area.  Genitourinary: Rectum normal, prostate normal, testes normal and penis normal.    Prostate is not enlarged and not tender.  Cremasteric reflex is present. Right testis shows no mass, no swelling and no tenderness. Right testis is descended. Cremasteric reflex is not absent on the right side. Left testis shows no mass, no swelling and no tenderness. Left testis is descended. Cremasteric reflex is not absent on the left side. Circumcised. No phimosis, paraphimosis, hypospadias, penile erythema or penile tenderness. No discharge found.  Musculoskeletal: Normal range of motion. He exhibits no edema, tenderness or deformity.  Lymphadenopathy:    He has no cervical adenopathy.  Neurological: He is alert and oriented to person, place, and time. He has normal reflexes. He displays normal reflexes. No cranial nerve deficit. He exhibits normal muscle tone. Coordination normal.  Skin: Skin is warm, dry and intact. No rash noted. He is not diaphoretic. No erythema. No pallor.  Psychiatric: He has a normal mood and affect. His speech is normal and behavior is normal. Judgment and thought content normal. Cognition and memory are normal.  Nursing note and vitals reviewed.   Results for orders placed or performed in visit on 08/13/16  CBC with Differential/Platelet  Result Value Ref Range   WBC 4.8 3.4 - 10.8 x10E3/uL   RBC 5.10 4.14 - 5.80 x10E6/uL   Hemoglobin 15.4 13.0 - 17.7 g/dL   Hematocrit 16.1 09.6 - 51.0 %   MCV 88 79 - 97 fL   MCH 30.2 26.6 - 33.0 pg   MCHC 34.3 31.5 - 35.7 g/dL   RDW 04.5 40.9 - 81.1 %   Platelets 206 150 - 379 x10E3/uL   Neutrophils 45  Not Estab. %   Lymphs 39 Not Estab. %   Monocytes 12 Not Estab. %   Eos 4 Not Estab. %   Basos 0 Not Estab. %   Neutrophils Absolute 2.2 1.4 - 7.0 x10E3/uL   Lymphocytes Absolute 1.9 0.7 - 3.1 x10E3/uL   Monocytes Absolute 0.6 0.1 - 0.9 x10E3/uL   EOS (ABSOLUTE) 0.2 0.0 - 0.4 x10E3/uL   Basophils Absolute 0.0 0.0 - 0.2 x10E3/uL   Immature Granulocytes 0 Not Estab. %   Immature Grans (Abs) 0.0 0.0 - 0.1 x10E3/uL  Comprehensive metabolic panel  Result Value Ref Range   Glucose 86 65 - 99 mg/dL   BUN 17 6 - 24 mg/dL   Creatinine, Ser 9.14 0.76 - 1.27 mg/dL   GFR calc non Af Amer 96 >59   GFR calc Af Amer 111 >59   BUN/Creatinine Ratio 20 9 - 20   Sodium 138 134 - 144 mmol/L   Potassium 4.5 3.5 - 5.2 mmol/L   Chloride 98 96 - 106 mmol/L   CO2 20 18 - 29 mmol/L   Calcium 9.5 8.7 - 10.2 mg/dL   Total Protein 6.9 6.0 - 8.5 g/dL   Albumin 4.5 3.5 - 5.5 g/dL   Globulin, Total 2.4 1.5 - 4.5   Albumin/Globulin Ratio 1.9 1.2 - 2.2   Bilirubin Total 0.5 0.0 - 1.2 mg/dL   Alkaline Phosphatase 52 39 - 117 IU/L   AST 21 0 - 40 IU/L   ALT 26 0 - 44 IU/L  Lipid panel  Result Value Ref Range   Cholesterol, Total 181 100 - 199 mg/dL   Triglycerides 782 0 - 149 mg/dL   HDL 43 >95 mg/dL   VLDL Cholesterol Cal 25 5 - 40   LDL Calculated 113 (H) 0 - 99   Chol/HDL Ratio 4.2 0.0 - 5.0  PSA  Result Value Ref Range   Prostate Specific Ag, Serum 0.9 0.0 -  4.0 ng/mL  TSH  Result Value Ref Range   TSH 1.470 0.450 - 4.500 uIU/mL  Urinalysis, Routine w reflex microscopic  Result Value Ref Range   Specific Gravity, UA <1.005 (L) 1.005 - 1.030   pH, UA 6.0 5.0 - 7.5   Color, UA Yellow Yellow   Appearance Ur Clear Clear   Leukocytes, UA Negative Negative   Protein, UA Negative Negative/Trace   Glucose, UA Negative Negative   Ketones, UA Negative Negative   RBC, UA 2+ (A) Negative   Bilirubin, UA Negative Negative   Urobilinogen, Ur 0.2 0.2 - 1.0 mg/dL   Nitrite, UA Negative Negative        Assessment & Plan:   Problem List Items Addressed This Visit      Cardiovascular and Mediastinum   Hypertension    BP under good control off medicine. Will check microalbumin. Continue to monitor.       Relevant Orders   Microalbumin, Urine Waived     Other   Back pain    Acting up for the past couple of months. Would like to get back into PT. Referral generated today and given to patient.       Relevant Orders   UA/M w/rflx Culture, Routine    Other Visit Diagnoses    Routine general medical examination at a health care facility    -  Primary   Vaccines up to date. Screening labs checked today. Colonoscopy declined. Continue diet and exercise. Call with any concerns.    Relevant Orders   CBC with Differential/Platelet   Comprehensive metabolic panel   Lipid Panel w/o Chol/HDL Ratio   PSA   TSH   UA/M w/rflx Culture, Routine       LABORATORY TESTING:  Health maintenance labs ordered today as discussed above.   The natural history of prostate cancer and ongoing controversy regarding screening and potential treatment outcomes of prostate cancer has been discussed with the patient. The meaning of a false positive PSA and a false negative PSA has been discussed. He indicates understanding of the limitations of this screening test and wishes to proceed with screening PSA testing.   IMMUNIZATIONS:   - Tdap: Tetanus vaccination status reviewed: last tetanus booster within 10 years. - Influenza: Up to date - Pneumovax: Not applicable  SCREENING: - Colonoscopy: Not applicable  Discussed with patient purpose of the colonoscopy is to detect colon cancer at curable precancerous or early stages   PATIENT COUNSELING:    Sexuality: Discussed sexually transmitted diseases, partner selection, use of condoms, avoidance of unintended pregnancy  and contraceptive alternatives.   Advised to avoid cigarette smoking.  I discussed with the patient that most people either abstain from  alcohol or drink within safe limits (<=14/week and <=4 drinks/occasion for males, <=7/weeks and <= 3 drinks/occasion for females) and that the risk for alcohol disorders and other health effects rises proportionally with the number of drinks per week and how often a drinker exceeds daily limits.  Discussed cessation/primary prevention of drug use and availability of treatment for abuse.   Diet: Encouraged to adjust caloric intake to maintain  or achieve ideal body weight, to reduce intake of dietary saturated fat and total fat, to limit sodium intake by avoiding high sodium foods and not adding table salt, and to maintain adequate dietary potassium and calcium preferably from fresh fruits, vegetables, and low-fat dairy products.    stressed the importance of regular exercise  Injury prevention: Discussed safety belts, safety helmets, smoke  detector, smoking near bedding or upholstery.   Dental health: Discussed importance of regular tooth brushing, flossing, and dental visits.   Follow up plan: NEXT PREVENTATIVE PHYSICAL DUE IN 1 YEAR. Return in about 1 year (around 08/18/2018) for Physical.

## 2017-08-18 NOTE — Patient Instructions (Addendum)

## 2017-08-19 LAB — PSA: Prostate Specific Ag, Serum: 0.8 ng/mL (ref 0.0–4.0)

## 2017-08-19 LAB — TSH: TSH: 1.43 u[IU]/mL (ref 0.450–4.500)

## 2017-08-19 LAB — COMPREHENSIVE METABOLIC PANEL
ALK PHOS: 49 IU/L (ref 39–117)
ALT: 16 IU/L (ref 0–44)
AST: 16 IU/L (ref 0–40)
Albumin/Globulin Ratio: 1.8 (ref 1.2–2.2)
Albumin: 4.5 g/dL (ref 3.5–5.5)
BILIRUBIN TOTAL: 0.6 mg/dL (ref 0.0–1.2)
BUN / CREAT RATIO: 17 (ref 9–20)
BUN: 15 mg/dL (ref 6–24)
CHLORIDE: 100 mmol/L (ref 96–106)
CO2: 23 mmol/L (ref 20–29)
Calcium: 9.2 mg/dL (ref 8.7–10.2)
Creatinine, Ser: 0.9 mg/dL (ref 0.76–1.27)
GFR calc Af Amer: 108 mL/min/{1.73_m2} (ref >59)
GFR calc non Af Amer: 94 mL/min/{1.73_m2} (ref >59)
GLUCOSE: 87 mg/dL (ref 65–99)
Globulin, Total: 2.5 g/dL (ref 1.5–4.5)
Potassium: 4.4 mmol/L (ref 3.5–5.2)
Sodium: 140 mmol/L (ref 134–144)
Total Protein: 7 g/dL (ref 6.0–8.5)

## 2017-08-19 LAB — CBC WITH DIFFERENTIAL/PLATELET
BASOS ABS: 0 10*3/uL (ref 0.0–0.2)
Basos: 0 %
EOS (ABSOLUTE): 0.1 10*3/uL (ref 0.0–0.4)
Eos: 2 %
HEMOGLOBIN: 15.3 g/dL (ref 13.0–17.7)
Hematocrit: 43.6 % (ref 37.5–51.0)
IMMATURE GRANS (ABS): 0 10*3/uL (ref 0.0–0.1)
IMMATURE GRANULOCYTES: 0 %
LYMPHS: 33 %
Lymphocytes Absolute: 1.6 10*3/uL (ref 0.7–3.1)
MCH: 30.8 pg (ref 26.6–33.0)
MCHC: 35.1 g/dL (ref 31.5–35.7)
MCV: 88 fL (ref 79–97)
MONOCYTES: 11 %
Monocytes Absolute: 0.5 10*3/uL (ref 0.1–0.9)
NEUTROS ABS: 2.5 10*3/uL (ref 1.4–7.0)
Neutrophils: 54 %
Platelets: 189 10*3/uL (ref 150–379)
RBC: 4.96 x10E6/uL (ref 4.14–5.80)
RDW: 13.7 % (ref 12.3–15.4)
WBC: 4.8 10*3/uL (ref 3.4–10.8)

## 2017-08-19 LAB — LIPID PANEL W/O CHOL/HDL RATIO
CHOLESTEROL TOTAL: 176 mg/dL (ref 100–199)
HDL: 44 mg/dL (ref >39)
LDL Calculated: 109 mg/dL — ABNORMAL HIGH (ref 0–99)
Triglycerides: 116 mg/dL (ref 0–149)
VLDL Cholesterol Cal: 23 mg/dL (ref 5–40)

## 2017-08-27 DIAGNOSIS — M545 Low back pain: Secondary | ICD-10-CM | POA: Diagnosis not present

## 2017-09-03 DIAGNOSIS — M545 Low back pain: Secondary | ICD-10-CM | POA: Diagnosis not present

## 2017-10-01 DIAGNOSIS — M545 Low back pain: Secondary | ICD-10-CM | POA: Diagnosis not present

## 2018-03-30 ENCOUNTER — Ambulatory Visit (INDEPENDENT_AMBULATORY_CARE_PROVIDER_SITE_OTHER): Payer: BLUE CROSS/BLUE SHIELD

## 2018-03-30 DIAGNOSIS — Z23 Encounter for immunization: Secondary | ICD-10-CM | POA: Diagnosis not present

## 2018-08-20 ENCOUNTER — Ambulatory Visit (INDEPENDENT_AMBULATORY_CARE_PROVIDER_SITE_OTHER): Payer: BLUE CROSS/BLUE SHIELD | Admitting: Family Medicine

## 2018-08-20 ENCOUNTER — Encounter: Payer: Self-pay | Admitting: Family Medicine

## 2018-08-20 ENCOUNTER — Other Ambulatory Visit: Payer: Self-pay

## 2018-08-20 VITALS — BP 130/87 | HR 55 | Temp 97.5°F | Ht 70.0 in | Wt 250.0 lb

## 2018-08-20 DIAGNOSIS — M25512 Pain in left shoulder: Secondary | ICD-10-CM

## 2018-08-20 DIAGNOSIS — J301 Allergic rhinitis due to pollen: Secondary | ICD-10-CM

## 2018-08-20 DIAGNOSIS — F325 Major depressive disorder, single episode, in full remission: Secondary | ICD-10-CM | POA: Diagnosis not present

## 2018-08-20 DIAGNOSIS — Z0000 Encounter for general adult medical examination without abnormal findings: Secondary | ICD-10-CM

## 2018-08-20 LAB — UA/M W/RFLX CULTURE, ROUTINE
BILIRUBIN UA: NEGATIVE
Glucose, UA: NEGATIVE
Ketones, UA: NEGATIVE
LEUKOCYTES UA: NEGATIVE
Nitrite, UA: NEGATIVE
Protein, UA: NEGATIVE
Specific Gravity, UA: <1.005 — ABNORMAL LOW (ref 1.005–1.030)
Urobilinogen, Ur: 0.2 mg/dL (ref 0.2–1.0)
pH, UA: 6 (ref 5.0–7.5)

## 2018-08-20 LAB — MICROSCOPIC EXAMINATION
Bacteria, UA: NONE SEEN
Epithelial Cells (non renal): NONE SEEN /hpf (ref 0–10)
WBC, UA: NONE SEEN /hpf (ref 0–5)

## 2018-08-20 NOTE — Assessment & Plan Note (Signed)
Stable off medication. Continue to monitor.

## 2018-08-20 NOTE — Patient Instructions (Signed)
Health Maintenance, Male A healthy lifestyle and preventive care is important for your health and wellness. Ask your health care provider about what schedule of regular examinations is right for you. What should I know about weight and diet? Eat a Healthy Diet  Eat plenty of vegetables, fruits, whole grains, low-fat dairy products, and lean protein.  Do not eat a lot of foods high in solid fats, added sugars, or salt.  Maintain a Healthy Weight Regular exercise can help you achieve or maintain a healthy weight. You should:  Do at least 150 minutes of exercise each week. The exercise should increase your heart rate and make you sweat (moderate-intensity exercise).  Do strength-training exercises at least twice a week. Watch Your Levels of Cholesterol and Blood Lipids  Have your blood tested for lipids and cholesterol every 5 years starting at 60 years of age. If you are at high risk for heart disease, you should start having your blood tested when you are 60 years old. You may need to have your cholesterol levels checked more often if: ? Your lipid or cholesterol levels are high. ? You are older than 60 years of age. ? You are at high risk for heart disease. What should I know about cancer screening? Many types of cancers can be detected early and may often be prevented. Lung Cancer  You should be screened every year for lung cancer if: ? You are a current smoker who has smoked for at least 30 years. ? You are a former smoker who has quit within the past 15 years.  Talk to your health care provider about your screening options, when you should start screening, and how often you should be screened. Colorectal Cancer  Routine colorectal cancer screening usually begins at 60 years of age and should be repeated every 5-10 years until you are 60 years old. You may need to be screened more often if early forms of precancerous polyps or small growths are found. Your health care provider may  recommend screening at an earlier age if you have risk factors for colon cancer.  Your health care provider may recommend using home test kits to check for hidden blood in the stool.  A small camera at the end of a tube can be used to examine your colon (sigmoidoscopy or colonoscopy). This checks for the earliest forms of colorectal cancer. Prostate and Testicular Cancer  Depending on your age and overall health, your health care provider may do certain tests to screen for prostate and testicular cancer.  Talk to your health care provider about any symptoms or concerns you have about testicular or prostate cancer. Skin Cancer  Check your skin from head to toe regularly.  Tell your health care provider about any new moles or changes in moles, especially if: ? There is a change in a mole's size, shape, or color. ? You have a mole that is larger than a pencil eraser.  Always use sunscreen. Apply sunscreen liberally and repeat throughout the day.  Protect yourself by wearing long sleeves, pants, a wide-brimmed hat, and sunglasses when outside. What should I know about heart disease, diabetes, and high blood pressure?  If you are 18-39 years of age, have your blood pressure checked every 3-5 years. If you are 40 years of age or older, have your blood pressure checked every year. You should have your blood pressure measured twice-once when you are at a hospital or clinic, and once when you are not at a hospital   or clinic. Record the average of the two measurements. To check your blood pressure when you are not at a hospital or clinic, you can use: ? An automated blood pressure machine at a pharmacy. ? A home blood pressure monitor.  Talk to your health care provider about your target blood pressure.  If you are between 79-60 years old, ask your health care provider if you should take aspirin to prevent heart disease.  Have regular diabetes screenings by checking your fasting blood sugar  level. ? If you are at a normal weight and have a low risk for diabetes, have this test once every three years after the age of 42. ? If you are overweight and have a high risk for diabetes, consider being tested at a younger age or more often.  A one-time screening for abdominal aortic aneurysm (AAA) by ultrasound is recommended for men aged 65-75 years who are current or former smokers. What should I know about preventing infection? Hepatitis B If you have a higher risk for hepatitis B, you should be screened for this virus. Talk with your health care provider to find out if you are at risk for hepatitis B infection. Hepatitis C Blood testing is recommended for:  Everyone born from [birth year] through 1967.  Anyone with known risk factors for hepatitis C. Sexually Transmitted Diseases (STDs)  You should be screened each year for STDs including gonorrhea and chlamydia if: ? You are sexually active and are younger than 60 years of age. ? You are older than 60 years of age and your health care provider tells you that you are at risk for this type of infection. ? Your sexual activity has changed since you were last screened and you are at an increased risk for chlamydia or gonorrhea. Ask your health care provider if you are at risk.  Talk with your health care provider about whether you are at high risk of being infected with HIV. Your health care provider may recommend a prescription medicine to help prevent HIV infection. What else can I do?  Schedule regular health, dental, and eye exams.  Stay current with your vaccines (immunizations).  Do not use any tobacco products, such as cigarettes, chewing tobacco, and e-cigarettes. If you need help quitting, ask your health care provider.  Limit alcohol intake to no more than 2 drinks per day. One drink equals 12 ounces of beer, 5 ounces of wine, or 1 ounces of hard liquor.  Do not use street drugs.  Do not share needles.  Ask your health  care provider for help if you need support or information about quitting drugs.  Tell your health care provider if you often feel depressed.  Tell your health care provider if you have ever been abused or do not feel safe at home. This information is not intended to replace advice given to you by your health care provider. Make sure you discuss any questions you have with your health care provider. Document Released: 12/06/2007 Document Revised: 02/06/2016 Document Reviewed: 03/13/2015 [employer] Interactive Patient Education  2019 [employer]  Shoulder Exercises Ask your health care provider which exercises are safe for you. Do exercises exactly as told by your health care provider and adjust them as directed. It is normal to feel mild stretching, pulling, tightness, or discomfort as you do these exercises, but you should stop right away if you feel sudden pain or your pain gets worse.Do not begin these exercises until told by your health care provider. Range  of Motion Exercises        These exercises warm up your muscles and joints and improve the movement and flexibility of your shoulder. These exercises also help to relieve pain, numbness, and tingling. These exercises involve stretching your injured shoulder directly. Exercise A: Pendulum 1. Stand near a wall or a surface that you can hold onto for balance. 2. Bend at the waist and let your left / right arm hang straight down. Use your other arm to support you. Keep your back straight and do not lock your knees. 3. Relax your left / right arm and shoulder muscles, and move your hips and your trunk so your left / right arm swings freely. Your arm should swing because of the motion of your body, not because you are using your arm or shoulder muscles. 4. Keep moving your body so your arm swings in the following directions, as told by your health care provider: ? Side to side. ? Forward and backward. ? In clockwise and counterclockwise  circles. 5. Continue each motion for __________ seconds, or for as long as told by your health care provider. 6. Slowly return to the starting position. Repeat __________ times. Complete this exercise __________ times a day. Exercise B:Flexion, Standing 1. Stand and hold a broomstick, a cane, or a similar object. Place your hands a little more than shoulder-width apart on the object. Your left / right hand should be palm-up, and your other hand should be palm-down. 2. Keep your elbow straight and keep your shoulder muscles relaxed. Push the stick down with your healthy arm to raise your left / right arm in front of your body, and then over your head until you feel a stretch in your shoulder. ? Avoid shrugging your shoulder while you raise your arm. Keep your shoulder blade tucked down toward the middle of your back. 3. Hold for __________ seconds. 4. Slowly return to the starting position. Repeat __________ times. Complete this exercise __________ times a day. Exercise C: Abduction, Standing 1. Stand and hold a broomstick, a cane, or a similar object. Place your hands a little more than shoulder-width apart on the object. Your left / right hand should be palm-up, and your other hand should be palm-down. 2. While keeping your elbow straight and your shoulder muscles relaxed, push the stick across your body toward your left / right side. Raise your left / right arm to the side of your body and then over your head until you feel a stretch in your shoulder. ? Do not raise your arm above shoulder height, unless your health care provider tells you to do that. ? Avoid shrugging your shoulder while you raise your arm. Keep your shoulder blade tucked down toward the middle of your back. 3. Hold for __________ seconds. 4. Slowly return to the starting position. Repeat __________ times. Complete this exercise __________ times a day. Exercise D:Internal Rotation 1. Place your left / right hand behind your  back, palm-up. 2. Use your other hand to dangle an exercise band, a towel, or a similar object over your shoulder. Grasp the band with your left / right hand so you are holding onto both ends. 3. Gently pull up on the band until you feel a stretch in the front of your left / right shoulder. ? Avoid shrugging your shoulder while you raise your arm. Keep your shoulder blade tucked down toward the middle of your back. 4. Hold for __________ seconds. 5. Release the stretch by letting go  of the band and lowering your hands. Repeat __________ times. Complete this exercise __________ times a day. Stretching Exercises  These exercises warm up your muscles and joints and improve the movement and flexibility of your shoulder. These exercises also help to relieve pain, numbness, and tingling. These exercises are done using your healthy shoulder to help stretch the muscles of your injured shoulder. Exercise E: [employer], [employer] (External Rotation and Abduction) 1. Stand in a doorway with one of your feet slightly in front of the other. This is called a staggered stance. If you cannot reach your forearms to the door frame, stand facing a corner of a room. 2. Choose one of the following positions as told by your health care provider: ? Place your hands and forearms on the door frame above your head. ? Place your hands and forearms on the door frame at the height of your head. ? Place your hands on the door frame at the height of your elbows. 3. Slowly move your weight onto your front foot until you feel a stretch across your chest and in the front of your shoulders. Keep your head and chest upright and keep your abdominal muscles tight. 4. Hold for __________ seconds. 5. To release the stretch, shift your weight to your back foot. Repeat __________ times. Complete this stretch __________ times a day. Exercise F:Extension, Standing 1. Stand and hold a broomstick, a cane, or a similar object behind your  back. ? Your hands should be a little wider than shoulder-width apart. ? Your palms should face away from your back. 2. Keeping your elbows straight and keeping your shoulder muscles relaxed, move the stick away from your body until you feel a stretch in your shoulder. ? Avoid shrugging your shoulders while you move the stick. Keep your shoulder blade tucked down toward the middle of your back. 3. Hold for __________ seconds. 4. Slowly return to the starting position. Repeat __________ times. Complete this exercise __________ times a day. Strengthening Exercises           These exercises build strength and endurance in your shoulder. Endurance is the ability to use your muscles for a long time, even after they get tired. Exercise G:External Rotation 1. Sit in a stable chair without armrests. 2. Secure an exercise band at elbow height on your left / right side. 3. Place a soft object, such as a folded towel or a small pillow, between your left / right upper arm and your body to move your elbow a few inches away (about 10 cm) from your side. 4. Hold the end of the band so it is tight and there is no slack. 5. Keeping your elbow pressed against the soft object, move your left / right forearm out, away from your abdomen. Keep your body steady so only your forearm moves. 6. Hold for __________ seconds. 7. Slowly return to the starting position. Repeat __________ times. Complete this exercise __________ times a day. Exercise H:Shoulder Abduction 1. Sit in a stable chair without armrests, or stand. 2. Hold a __________ weight in your left / right hand, or hold an exercise band with both hands. 3. Start with your arms straight down and your left / right palm facing in, toward your body. 4. Slowly lift your left / right hand out to your side. Do not lift your hand above shoulder height unless your health care provider tells you that this is safe. ? Keep your arms straight. ? Avoid shrugging  your shoulder  while you do this movement. Keep your shoulder blade tucked down toward the middle of your back. 5. Hold for __________ seconds. 6. Slowly lower your arm, and return to the starting position. Repeat __________ times. Complete this exercise __________ times a day. Exercise I:Shoulder Extension 1. Sit in a stable chair without armrests, or stand. 2. Secure an exercise band to a stable object in front of you where it is at shoulder height. 3. Hold one end of the exercise band in each hand. Your palms should face each other. 4. Straighten your elbows and lift your hands up to shoulder height. 5. Step back, away from the secured end of the exercise band, until the band is tight and there is no slack. 6. Squeeze your shoulder blades together as you pull your hands down to the sides of your thighs. Stop when your hands are straight down by your sides. Do not let your hands go behind your body. 7. Hold for __________ seconds. 8. Slowly return to the starting position. Repeat __________ times. Complete this exercise __________ times a day. Exercise J:Standing Shoulder Row 1. Sit in a stable chair without armrests, or stand. 2. Secure an exercise band to a stable object in front of you so it is at waist height. 3. Hold one end of the exercise band in each hand. Your palms should be in a thumbs-up position. 4. Bend each of your elbows to an "L" shape (about 90 degrees) and keep your upper arms at your sides. 5. Step back until the band is tight and there is no slack. 6. Slowly pull your elbows back behind you. 7. Hold for __________ seconds. 8. Slowly return to the starting position. Repeat __________ times. Complete this exercise __________ times a day. Exercise K:Shoulder Press-Ups 1. Sit in a stable chair that has armrests. Sit upright, with your feet flat on the floor. 2. Put your hands on the armrests so your elbows are bent and your fingers are pointing forward. Your hands  should be about even with the sides of your body. 3. Push down on the armrests and use your arms to lift yourself off of the chair. Straighten your elbows and lift yourself up as much as you comfortably can. ? Move your shoulder blades down, and avoid letting your shoulders move up toward your ears. ? Keep your feet on the ground. As you get stronger, your feet should support less of your body weight as you lift yourself up. 4. Hold for __________ seconds. 5. Slowly lower yourself back into the chair. Repeat __________ times. Complete this exercise __________ times a day. Exercise L: Wall Push-Ups 1. Stand so you are facing a stable wall. Your feet should be about one arm-length away from the wall. 2. Lean forward and place your palms on the wall at shoulder height. 3. Keep your feet flat on the floor as you bend your elbows and lean forward toward the wall. 4. Hold for __________ seconds. 5. Straighten your elbows to push yourself back to the starting position. Repeat __________ times. Complete this exercise __________ times a day. This information is not intended to replace advice given to you by your health care provider. Make sure you discuss any questions you have with your health care provider. Document Released: 04/23/2005 Document Revised: 10/13/2017 Document Reviewed: 02/18/2015 [employer] Interactive Patient Education  2019 [employer].

## 2018-08-20 NOTE — Progress Notes (Signed)
BP 130/87   Pulse (!) 55   Temp (!) 97.5 F (36.4 C) (Oral)   Ht  (1.778 m)   Wt 250 lb (113.4 kg)   SpO2 99%   BMI 35.87 kg/m    Subjective:    Patient ID: Vernon Murray, male    DOB: 04-Sep-1960, 58 y.o.   MRN: [id number]  HPI: Vernon Murray is a 58 y.o. male presenting on 08/20/2018 for comprehensive medical examination. Current medical complaints include:  UPPER RESPIRATORY TRACT INFECTION Duration: about a month Worst symptom: cough Fever: no Cough: yes Shortness of breath: no Wheezing: no Chest pain: no Chest tightness: no Chest congestion: no Nasal congestion: no Runny nose: no Post nasal drip: no Sneezing: no Sore throat: no Swollen glands: no Sinus pressure: yes Headache: no Face pain: no Toothache: no Ear pain: no  Ear pressure: no Eyes red/itching:no Eye drainage/crusting: no  Vomiting: no Rash: no Fatigue: yes Sick contacts: yes Strep contacts: no  Context: stable Recurrent sinusitis: no Relief with OTC cold/cough medications: no  Treatments attempted: none   L shoulder pain for about 3 weeks- didn't hurt it. In the back where he hurt it before. Has been using ice and aleve.   He currently lives with: wife Interim Problems from his last visit: yes- see above  Depression Screen done today and results listed below:  Depression screen [facility] 2/9 08/20/2018 08/18/2017 08/13/2016 07/30/2015  Decreased Interest 0 1 0 1  Down, Depressed, Hopeless 1 1 0 0  PHQ - 2 Score 1 2 0 1  Altered sleeping 0 1 - -  Tired, decreased energy 1 0 - -  Change in appetite 0 0 - -  Feeling bad or failure about yourself  1 1 - -  Trouble concentrating 1 1 - -  Moving slowly or fidgety/restless 0 0 - -  Suicidal thoughts 0 0 - -  PHQ-9 Score 4 5 - -  Difficult doing work/chores - Somewhat difficult - -    Past Medical History:  Past Medical History:  Diagnosis Date  . Depression   . Elevated liver enzymes   . Fatty liver   . Hypertension   .  Migraine   . Obesity     Surgical History:  Past Surgical History:  Procedure Laterality Date  . GROIN MASS OPEN BIOPSY     non cancerous  . NASAL SINUS SURGERY      Medications:  Current Outpatient Medications on File Prior to Visit  Medication Sig  . cholecalciferol (VITAMIN D) 1000 units tablet Take 1,000 Units by mouth daily.   No current facility-administered medications on file prior to visit.     Allergies:  Allergies  Allergen Reactions  . Sulfur   . Valium [Diazepam]     Social History:  Social History   Socioeconomic History  . Marital status: Married    Spouse name: Not on file  . Number of children: Not on file  . Years of education: Not on file  . Highest education level: Not on file  Occupational History  . Not on file  Social Needs  . Financial resource strain: Not on file  . Food insecurity:    Worry: Not on file    Inability: Not on file  . Transportation needs:    Medical: Not on file    Non-medical: Not on file  Tobacco Use  . Smoking status: Never Smoker  . Smokeless tobacco: Never Used  Substance and Sexual Activity  .  Alcohol use: Yes    Alcohol/week: 2.0 standard drinks    Types: 2 Cans of beer per week  . Drug use: No  . Sexual activity: Yes  Lifestyle  . Physical activity:    Days per week: Not on file    Minutes per session: Not on file  . Stress: Not on file  Relationships  . Social connections:    Talks on phone: Not on file    Gets together: Not on file    Attends religious service: Not on file    Active member of club or organization: Not on file    Attends meetings of clubs or organizations: Not on file    Relationship status: Not on file  . Intimate partner violence:    Fear of current or ex partner: Not on file    Emotionally abused: Not on file    Physically abused: Not on file    Forced sexual activity: Not on file  Other Topics Concern  . Not on file  Social History Narrative  . Not on file   Social  History   Tobacco Use  Smoking Status Never Smoker  Smokeless Tobacco Never Used   Social History   Substance and Sexual Activity  Alcohol Use Yes  . Alcohol/week: 2.0 standard drinks  . Types: 2 Cans of beer per week    Family History:  Family History  Problem Relation Age of Onset  . Hypertension Mother   . Stroke Mother 41       AVM or bleed in the brain x 2  . Pulmonary fibrosis Mother   . Hypertension Father   . Stroke Father   . Deep vein thrombosis Father   . Pulmonary embolism Father   . Pulmonary fibrosis Father   . Cancer Father        breast and prostate cancer  . Heart disease Paternal Grandfather   . COPD Neg Hx   . Diabetes Neg Hx     Past medical history, surgical history, medications, allergies, family history and social history reviewed with patient today and changes made to appropriate areas of the chart.   Review of Systems  Constitutional: Negative.   HENT: Positive for congestion and hearing loss. Negative for ear discharge, ear pain, nosebleeds, sinus pain, sore throat and tinnitus.   Eyes: Negative.   Respiratory: Positive for cough. Negative for hemoptysis, sputum production, shortness of breath, wheezing and stridor.   Cardiovascular: Negative.   Gastrointestinal: Negative.   Genitourinary: Negative.   Musculoskeletal: Positive for myalgias. Negative for back pain, falls, joint pain and neck pain.  Skin: Negative.   Neurological: Negative.   Endo/Heme/Allergies: Positive for environmental allergies. Negative for polydipsia. Does not bruise/bleed easily.  Psychiatric/Behavioral: Negative.     All other ROS negative except what is listed above and in the HPI.      Objective:    BP 130/87   Pulse (!) 55   Temp (!) 97.5 F (36.4 C) (Oral)   Ht 5\' 10"  (1.778 m)   Wt 250 lb (113.4 kg)   SpO2 99%   BMI 35.87 kg/m   Wt Readings from Last 3 Encounters:  08/20/18 250 lb (113.4 kg)  08/18/17 250 lb 4 oz (113.5 kg)  08/13/16 265 lb  (120.2 kg)    Physical Exam Vitals signs and nursing note reviewed.  Constitutional:      General: He is not in acute distress.    Appearance: Normal appearance. He is obese. He is  not ill-appearing, toxic-appearing or diaphoretic.  HENT:     Head: Normocephalic and atraumatic.     Right Ear: Tympanic membrane, ear canal and external ear normal. There is no impacted cerumen.     Left Ear: Tympanic membrane, ear canal and external ear normal. There is no impacted cerumen.     Nose: Nose normal. No congestion or rhinorrhea.     Mouth/Throat:     Mouth: Mucous membranes are moist.     Pharynx: Oropharynx is clear. No oropharyngeal exudate or posterior oropharyngeal erythema.  Eyes:     General: No scleral icterus.       Right eye: No discharge.        Left eye: No discharge.     Extraocular Movements: Extraocular movements intact.     Conjunctiva/sclera: Conjunctivae normal.     Pupils: Pupils are equal, round, and reactive to light.  Neck:     Musculoskeletal: Normal range of motion and neck supple. No neck rigidity or muscular tenderness.     Vascular: No carotid bruit.  Cardiovascular:     Rate and Rhythm: Normal rate and regular rhythm.     Pulses: Normal pulses.     Heart sounds: No murmur. No friction rub. No gallop.   Pulmonary:     Effort: Pulmonary effort is normal. No respiratory distress.     Breath sounds: Normal breath sounds. No stridor. No wheezing, rhonchi or rales.  Chest:     Chest wall: No tenderness.  Abdominal:     General: Abdomen is flat. Bowel sounds are normal. There is no distension.     Palpations: Abdomen is soft. There is no mass.     Tenderness: There is no abdominal tenderness. There is no right CVA tenderness, left CVA tenderness, guarding or rebound.     Hernia: No hernia is present.  Genitourinary:    Comments: Genital exam deferred with shared decision making Musculoskeletal:        General: No swelling, tenderness, deformity or signs of  injury.     Right lower leg: No edema.     Left lower leg: No edema.  Lymphadenopathy:     Cervical: No cervical adenopathy.  Skin:    General: Skin is warm and dry.     Capillary Refill: Capillary refill takes less than 2 seconds.     Coloration: Skin is not jaundiced or pale.     Findings: No bruising, erythema, lesion or rash.  Neurological:     General: No focal deficit present.     Mental Status: He is alert and oriented to person, place, and time.     Cranial Nerves: No cranial nerve deficit.     Sensory: No sensory deficit.     Motor: No weakness.     Coordination: Coordination normal.     Gait: Gait normal.     Deep Tendon Reflexes: Reflexes normal.  Psychiatric:        Mood and Affect: Mood normal.        Behavior: Behavior normal.        Thought Content: Thought content normal.        Judgment: Judgment normal.     Results for orders placed or performed in visit on 08/18/17  Microscopic Examination  Result Value Ref Range   WBC, UA None seen 0 - 5 /hpf   RBC, UA 0-2 0 - 2 /hpf   Epithelial Cells (non renal) CANCELED    Bacteria, UA None seen None seen/Few  CBC with Differential/Platelet  Result Value Ref Range   WBC 4.8 3.4 - 10.8 x10E3/uL   RBC 4.96 4.14 - 5.80 x10E6/uL   Hemoglobin 15.3 13.0 - 17.7 g/dL   Hematocrit 16.1 09.6 - 51.0 %   MCV 88 79 - 97 fL   MCH 30.8 26.6 - 33.0 pg   MCHC 35.1 31.5 - 35.7 g/dL   RDW 04.5 40.9 - 81.1 %   Platelets 189 150 - 379 x10E3/uL   Neutrophils 54 Not Estab. %   Lymphs 33 Not Estab. %   Monocytes 11 Not Estab. %   Eos 2 Not Estab. %   Basos 0 Not Estab. %   Neutrophils Absolute 2.5 1.4 - 7.0 x10E3/uL   Lymphocytes Absolute 1.6 0.7 - 3.1 x10E3/uL   Monocytes Absolute 0.5 0.1 - 0.9 x10E3/uL   EOS (ABSOLUTE) 0.1 0.0 - 0.4 x10E3/uL   Basophils Absolute 0.0 0.0 - 0.2 x10E3/uL   Immature Granulocytes 0 Not Estab. %   Immature Grans (Abs) 0.0 0.0 - 0.1 x10E3/uL  Comprehensive metabolic panel  Result Value Ref Range    Glucose 87 65 - 99 mg/dL   BUN 15 6 - 24 mg/dL   Creatinine, Ser 9.14 0.76 - 1.27 mg/dL   GFR calc non Af Amer 94 >59 mL/min/1.73   GFR calc Af Amer 108 >59 mL/min/1.73   BUN/Creatinine Ratio 17 9 - 20   Sodium 140 134 - 144 mmol/L   Potassium 4.4 3.5 - 5.2 mmol/L   Chloride 100 96 - 106 mmol/L   CO2 23 20 - 29 mmol/L   Calcium 9.2 8.7 - 10.2 mg/dL   Total Protein 7.0 6.0 - 8.5 g/dL   Albumin 4.5 3.5 - 5.5 g/dL   Globulin, Total 2.5 1.5 - 4.5 g/dL   Albumin/Globulin Ratio 1.8 1.2 - 2.2   Bilirubin Total 0.6 0.0 - 1.2 mg/dL   Alkaline Phosphatase 49 39 - 117 IU/L   AST 16 0 - 40 IU/L   ALT 16 0 - 44 IU/L  Lipid Panel w/o Chol/HDL Ratio  Result Value Ref Range   Cholesterol, Total 176 100 - 199 mg/dL   Triglycerides 782 0 - 149 mg/dL   HDL 44 >95 mg/dL   VLDL Cholesterol Cal 23 5 - 40 mg/dL   LDL Calculated 621 (H) 0 - 99 mg/dL  Microalbumin, Urine Waived  Result Value Ref Range   Microalb, Ur Waived 10 0 - 19 mg/L   Creatinine, Urine Waived 100 10 - 300 mg/dL   Microalb/Creat Ratio <30 <30 mg/g  PSA  Result Value Ref Range   Prostate Specific Ag, Serum 0.8 0.0 - 4.0 ng/mL  TSH  Result Value Ref Range   TSH 1.430 0.450 - 4.500 uIU/mL  UA/M w/rflx Culture, Routine  Result Value Ref Range   Specific Gravity, UA 1.010 1.005 - 1.030   pH, UA 6.0 5.0 - 7.5   Color, UA Yellow Yellow   Appearance Ur Clear Clear   Leukocytes, UA Negative Negative   Protein, UA Negative Negative/Trace   Glucose, UA Negative Negative   Ketones, UA Negative Negative   RBC, UA 1+ (A) Negative   Bilirubin, UA Negative Negative   Urobilinogen, Ur 0.2 0.2 - 1.0 mg/dL   Nitrite, UA Negative Negative   Microscopic Examination See below:       Assessment & Plan:   Problem List Items Addressed This Visit      Other   Depression    Stable off medication.  Continue to monitor.        Other Visit Diagnoses    Routine general medical examination at a health care facility    -  Primary    Vaccines up to date. Screening labs checked today. Colonoscopy declined. Continue diet and exercise. Call with any concerns.    Relevant Orders   CBC with Differential/Platelet   Comprehensive metabolic panel   Lipid Panel w/o Chol/HDL Ratio   PSA   TSH   UA/M w/rflx Culture, Routine   Seasonal allergic rhinitis due to pollen       Start OTC allergy medicine. Call with any concerns. Continue to monitor.    Acute pain of left shoulder       Improving. Will start stretches and call if getting worse and we will set up x-ray or PT.       LABORATORY TESTING:  Health maintenance labs ordered today as discussed above.   The natural history of prostate cancer and ongoing controversy regarding screening and potential treatment outcomes of prostate cancer has been discussed with the patient. The meaning of a false positive PSA and a false negative PSA has been discussed. He indicates understanding of the limitations of this screening test and wishes to proceed with screening PSA testing.   IMMUNIZATIONS:   - Tdap: Tetanus vaccination status reviewed: last tetanus booster within 10 years. - Influenza: Up to date - Pneumovax: Not applicable  SCREENING: - Colonoscopy: Refused  Discussed with patient purpose of the colonoscopy is to detect colon cancer at curable precancerous or early stages   PATIENT COUNSELING:    Sexuality: Discussed sexually transmitted diseases, partner selection, use of condoms, avoidance of unintended pregnancy  and contraceptive alternatives.   Advised to avoid cigarette smoking.  I discussed with the patient that most people either abstain from alcohol or drink within safe limits (<=14/week and <=4 drinks/occasion for males, <=7/weeks and <= 3 drinks/occasion for females) and that the risk for alcohol disorders and other health effects rises proportionally with the number of drinks per week and how often a drinker exceeds daily limits.  Discussed cessation/primary  prevention of drug use and availability of treatment for abuse.   Diet: Encouraged to adjust caloric intake to maintain  or achieve ideal body weight, to reduce intake of dietary saturated fat and total fat, to limit sodium intake by avoiding high sodium foods and not adding table salt, and to maintain adequate dietary potassium and calcium preferably from fresh fruits, vegetables, and low-fat dairy products.    stressed the importance of regular exercise  Injury prevention: Discussed safety belts, safety helmets, smoke detector, smoking near bedding or upholstery.   Dental health: Discussed importance of regular tooth brushing, flossing, and dental visits.   Follow up plan: NEXT PREVENTATIVE PHYSICAL DUE IN 1 YEAR. Return in about 1 year (around 08/21/2019) for Physical.

## 2018-08-21 LAB — COMPREHENSIVE METABOLIC PANEL
A/G RATIO: 2 (ref 1.2–2.2)
ALT: 27 IU/L (ref 0–44)
AST: 22 IU/L (ref 0–40)
Albumin: 4.4 g/dL (ref 3.8–4.9)
Alkaline Phosphatase: 46 IU/L (ref 39–117)
BILIRUBIN TOTAL: 0.7 mg/dL (ref 0.0–1.2)
BUN/Creatinine Ratio: 15 (ref 9–20)
BUN: 13 mg/dL (ref 6–24)
CHLORIDE: 100 mmol/L (ref 96–106)
CO2: 24 mmol/L (ref 20–29)
Calcium: 9.5 mg/dL (ref 8.7–10.2)
Creatinine, Ser: 0.88 mg/dL (ref 0.76–1.27)
GFR calc Af Amer: 109 mL/min/{1.73_m2} (ref >59)
GFR calc non Af Amer: 94 mL/min/{1.73_m2} (ref >59)
Globulin, Total: 2.2 g/dL (ref 1.5–4.5)
Glucose: 71 mg/dL (ref 65–99)
POTASSIUM: 4.4 mmol/L (ref 3.5–5.2)
Sodium: 139 mmol/L (ref 134–144)
Total Protein: 6.6 g/dL (ref 6.0–8.5)

## 2018-08-21 LAB — CBC WITH DIFFERENTIAL/PLATELET
BASOS: 1 %
Basophils Absolute: 0 10*3/uL (ref 0.0–0.2)
EOS (ABSOLUTE): 0.2 10*3/uL (ref 0.0–0.4)
Eos: 3 %
Hematocrit: 44.5 % (ref 37.5–51.0)
Hemoglobin: 15.2 g/dL (ref 13.0–17.7)
Immature Grans (Abs): 0 10*3/uL (ref 0.0–0.1)
Immature Granulocytes: 0 %
LYMPHS ABS: 1.5 10*3/uL (ref 0.7–3.1)
Lymphs: 34 %
MCH: 30.6 pg (ref 26.6–33.0)
MCHC: 34.2 g/dL (ref 31.5–35.7)
MCV: 90 fL (ref 79–97)
MONOS ABS: 0.5 10*3/uL (ref 0.1–0.9)
Monocytes: 12 %
NEUTROS ABS: 2.3 10*3/uL (ref 1.4–7.0)
NEUTROS PCT: 50 %
PLATELETS: 198 10*3/uL (ref 150–450)
RBC: 4.97 x10E6/uL (ref 4.14–5.80)
RDW: 13.1 % (ref 11.6–15.4)
WBC: 4.5 10*3/uL (ref 3.4–10.8)

## 2018-08-21 LAB — TSH: TSH: 1.43 u[IU]/mL (ref 0.450–4.500)

## 2018-08-21 LAB — PSA: Prostate Specific Ag, Serum: 0.7 ng/mL (ref 0.0–4.0)

## 2018-08-21 LAB — LIPID PANEL W/O CHOL/HDL RATIO
Cholesterol, Total: 164 mg/dL (ref 100–199)
HDL: 49 mg/dL (ref >39)
LDL Calculated: 103 mg/dL — ABNORMAL HIGH (ref 0–99)
TRIGLYCERIDES: 62 mg/dL (ref 0–149)
VLDL Cholesterol Cal: 12 mg/dL (ref 5–40)

## 2019-02-23 ENCOUNTER — Ambulatory Visit (INDEPENDENT_AMBULATORY_CARE_PROVIDER_SITE_OTHER): Payer: BLUE CROSS/BLUE SHIELD

## 2019-02-23 ENCOUNTER — Other Ambulatory Visit: Payer: Self-pay

## 2019-02-23 DIAGNOSIS — Z23 Encounter for immunization: Secondary | ICD-10-CM | POA: Diagnosis not present

## 2019-06-15 ENCOUNTER — Ambulatory Visit: Payer: BLUE CROSS/BLUE SHIELD | Attending: Internal Medicine

## 2019-06-15 DIAGNOSIS — Z20828 Contact with and (suspected) exposure to other viral communicable diseases: Secondary | ICD-10-CM | POA: Diagnosis not present

## 2019-06-15 DIAGNOSIS — Z20822 Contact with and (suspected) exposure to covid-19: Secondary | ICD-10-CM

## 2019-06-16 LAB — NOVEL CORONAVIRUS, NAA: SARS-CoV-2, NAA: NOT DETECTED

## 2019-08-25 ENCOUNTER — Other Ambulatory Visit: Payer: Self-pay

## 2019-08-25 ENCOUNTER — Ambulatory Visit (INDEPENDENT_AMBULATORY_CARE_PROVIDER_SITE_OTHER): Payer: BC Managed Care – PPO | Admitting: Family Medicine

## 2019-08-25 ENCOUNTER — Encounter: Payer: Self-pay | Admitting: Family Medicine

## 2019-08-25 VITALS — BP 128/72 | HR 62 | Temp 97.9°F | Ht 69.69 in | Wt 254.4 lb

## 2019-08-25 DIAGNOSIS — Z6836 Body mass index (BMI) 36.0-36.9, adult: Secondary | ICD-10-CM

## 2019-08-25 DIAGNOSIS — I10 Essential (primary) hypertension: Secondary | ICD-10-CM | POA: Diagnosis not present

## 2019-08-25 DIAGNOSIS — Z0000 Encounter for general adult medical examination without abnormal findings: Secondary | ICD-10-CM

## 2019-08-25 DIAGNOSIS — E6609 Other obesity due to excess calories: Secondary | ICD-10-CM | POA: Diagnosis not present

## 2019-08-25 LAB — UA/M W/RFLX CULTURE, ROUTINE
Bilirubin, UA: NEGATIVE
Glucose, UA: NEGATIVE
Ketones, UA: NEGATIVE
Leukocytes,UA: NEGATIVE
Nitrite, UA: NEGATIVE
Protein,UA: NEGATIVE
Specific Gravity, UA: 1.015 (ref 1.005–1.030)
Urobilinogen, Ur: 0.2 mg/dL (ref 0.2–1.0)
pH, UA: 6 (ref 5.0–7.5)

## 2019-08-25 LAB — MICROSCOPIC EXAMINATION
Bacteria, UA: NONE SEEN
WBC, UA: NONE SEEN /hpf (ref 0–5)

## 2019-08-25 LAB — MICROALBUMIN, URINE WAIVED
Creatinine, Urine Waived: 50 mg/dL (ref 10–300)
Microalb, Ur Waived: 10 mg/L (ref 0–19)
Microalb/Creat Ratio: <30 mg/g (ref <30)

## 2019-08-25 LAB — BAYER DCA HB A1C WAIVED: HB A1C (BAYER DCA - WAIVED): 5 % (ref <7.0)

## 2019-08-25 NOTE — Progress Notes (Signed)
BP 128/72 (BP Location: Left Arm, Cuff Size: Normal)   Pulse 62   Temp 97.9 F (36.6 C) (Oral)   Ht 5' 9.69" (1.77 m)   Wt 254 lb 6 oz (115.4 kg)   SpO2 98%   BMI 36.83 kg/m    Subjective:    Patient ID: Vernon Murray, male    DOB: 15-Nov-1960, 59 y.o.   MRN: [id number]  HPI: Irvan Tiedt is a 59 y.o. male presenting on 08/25/2019 for comprehensive medical examination. Current medical complaints include:  HYPERTENSION Hypertension status: stable  Satisfied with current treatment? yes Duration of hypertension: fluctuating BP monitoring frequency:  a few times a week Aspirin: no Recurrent headaches: yes Visual changes: no Palpitations: no Dyspnea: no Chest pain: no Lower extremity edema: no Dizzy/lightheaded: no  Interim Problems from his last visit: no  Depression Screen done today and results listed below:  Depression screen [facility] 2/9 08/25/2019 08/20/2018 08/18/2017 08/13/2016 07/30/2015  Decreased Interest 0 0 1 0 1  Down, Depressed, Hopeless 0 1 1 0 0  PHQ - 2 Score 0 1 2 0 1  Altered sleeping 1 0 1 - -  Tired, decreased energy 0 1 0 - -  Change in appetite 0 0 0 - -  Feeling bad or failure about yourself  1 1 1  - -  Trouble concentrating 0 1 1 - -  Moving slowly or fidgety/restless 0 0 0 - -  Suicidal thoughts 0 0 0 - -  PHQ-9 Score 2 4 5  - -  Difficult doing work/chores Not difficult at all - Somewhat difficult - -    Past Medical History:  Past Medical History:  Diagnosis Date  . Depression   . Elevated liver enzymes   . Fatty liver   . Hypertension   . Migraine   . Obesity     Surgical History:  Past Surgical History:  Procedure Laterality Date  . GROIN MASS OPEN BIOPSY     non cancerous  . NASAL SINUS SURGERY      Medications:  Current Outpatient Medications on File Prior to Visit  Medication Sig  . cholecalciferol (VITAMIN D) 1000 units tablet Take 1,000 Units by mouth daily.   No current facility-administered medications on file prior  to visit.    Allergies:  Allergies  Allergen Reactions  . Sulfur   . Valium [Diazepam]     Social History:  Social History   Socioeconomic History  . Marital status: Married    Spouse name: Not on file  . Number of children: Not on file  . Years of education: Not on file  . Highest education level: Not on file  Occupational History  . Not on file  Tobacco Use  . Smoking status: Never Smoker  . Smokeless tobacco: Never Used  Substance and Sexual Activity  . Alcohol use: Yes    Alcohol/week: 2.0 standard drinks    Types: 2 Cans of beer per week  . Drug use: No  . Sexual activity: Yes  Other Topics Concern  . Not on file  Social History Narrative  . Not on file   Social Determinants of Health   Financial Resource Strain:   . Difficulty of Paying Living Expenses: Not on file  Food Insecurity:   . Worried About in the Last Year: Not on file  . Ran Out of Food in the Last Year: Not on file  Transportation Needs:   . Lack of Transportation (Medical):  Not on file  . Lack of Transportation (Non-Medical): Not on file  Physical Activity:   . Days of Exercise per Week: Not on file  . Minutes of Exercise per Session: Not on file  Stress:   . Feeling of Stress : Not on file  Social Connections:   . Frequency of Communication with Friends and Family: Not on file  . Frequency of Social Gatherings with Friends and Family: Not on file  . Attends Religious Services: Not on file  . Active Member of Clubs or Organizations: Not on file  . Attends Archivist Meetings: Not on file  . Marital Status: Not on file  Intimate Partner Violence:   . Fear of Current or Ex-Partner: Not on file  . Emotionally Abused: Not on file  . Physically Abused: Not on file  . Sexually Abused: Not on file   Social History   Tobacco Use  Smoking Status Never Smoker  Smokeless Tobacco Never Used   Social History   Substance and Sexual Activity  Alcohol Use Yes    . Alcohol/week: 2.0 standard drinks  . Types: 2 Cans of beer per week    Family History:  Family History  Problem Relation Age of Onset  . Hypertension Mother   . Stroke Mother 79       AVM or bleed in the brain x 2  . Pulmonary fibrosis Mother   . Hypertension Father   . Stroke Father   . Deep vein thrombosis Father   . Pulmonary embolism Father   . Pulmonary fibrosis Father   . Cancer Father        breast and prostate cancer  . Heart disease Paternal Grandfather   . COPD Neg Hx   . Diabetes Neg Hx     Past medical history, surgical history, medications, allergies, family history and social history reviewed with patient today and changes made to appropriate areas of the chart.   Review of Systems  Constitutional: Negative.   HENT: Negative.   Eyes: Negative.   Respiratory: Negative.   Cardiovascular: Negative.   Gastrointestinal: Negative.        Hemorrhoids+  Genitourinary: Negative.   Musculoskeletal: Negative.   Skin: Negative.   Neurological: Negative.   Endo/Heme/Allergies: Negative for environmental allergies and polydipsia. Bruises/bleeds easily.  Psychiatric/Behavioral: Negative.     All other ROS negative except what is listed above and in the HPI.      Objective:    BP 128/72 (BP Location: Left Arm, Cuff Size: Normal)   Pulse 62   Temp 97.9 F (36.6 C) (Oral)   Ht 5' 9.69" (1.77 m)   Wt 254 lb 6 oz (115.4 kg)   SpO2 98%   BMI 36.83 kg/m   Wt Readings from Last 3 Encounters:  08/25/19 254 lb 6 oz (115.4 kg)  08/20/18 250 lb (113.4 kg)  08/18/17 250 lb 4 oz (113.5 kg)    Physical Exam Vitals and nursing note reviewed.  Constitutional:      General: He is not in acute distress.    Appearance: Normal appearance. He is obese. He is not ill-appearing, toxic-appearing or diaphoretic.  HENT:     Head: Normocephalic and atraumatic.     Right Ear: Tympanic membrane, ear canal and external ear normal. There is no impacted cerumen.     Left Ear:  Tympanic membrane, ear canal and external ear normal. There is no impacted cerumen.     Nose: Nose normal. No congestion or rhinorrhea.  Mouth/Throat:     Mouth: Mucous membranes are moist.     Pharynx: Oropharynx is clear. No oropharyngeal exudate or posterior oropharyngeal erythema.  Eyes:     General: No scleral icterus.       Right eye: No discharge.        Left eye: No discharge.     Extraocular Movements: Extraocular movements intact.     Conjunctiva/sclera: Conjunctivae normal.     Pupils: Pupils are equal, round, and reactive to light.  Neck:     Vascular: No carotid bruit.  Cardiovascular:     Rate and Rhythm: Normal rate and regular rhythm.     Pulses: Normal pulses.     Heart sounds: No murmur. No friction rub. No gallop.   Pulmonary:     Effort: Pulmonary effort is normal. No respiratory distress.     Breath sounds: Normal breath sounds. No stridor. No wheezing, rhonchi or rales.  Chest:     Chest wall: No tenderness.  Abdominal:     General: Abdomen is flat. Bowel sounds are normal. There is no distension.     Palpations: Abdomen is soft. There is no mass.     Tenderness: There is no abdominal tenderness. There is no right CVA tenderness, left CVA tenderness, guarding or rebound.     Hernia: No hernia is present.  Genitourinary:    Comments: Genital exam deferred with shared decision making Musculoskeletal:        General: No swelling, tenderness, deformity or signs of injury.     Cervical back: Normal range of motion and neck supple. No rigidity. No muscular tenderness.     Right lower leg: No edema.     Left lower leg: No edema.  Lymphadenopathy:     Cervical: No cervical adenopathy.  Skin:    General: Skin is warm and dry.     Capillary Refill: Capillary refill takes less than 2 seconds.     Coloration: Skin is not jaundiced or pale.     Findings: No bruising, erythema, lesion or rash.  Neurological:     General: No focal deficit present.     Mental  Status: He is alert and oriented to person, place, and time.     Cranial Nerves: No cranial nerve deficit.     Sensory: No sensory deficit.     Motor: No weakness.     Coordination: Coordination normal.     Gait: Gait normal.     Deep Tendon Reflexes: Reflexes normal.  Psychiatric:        Mood and Affect: Mood normal.        Behavior: Behavior normal.        Thought Content: Thought content normal.        Judgment: Judgment normal.     Results for orders placed or performed in visit on 06/15/19  Novel Coronavirus, NAA ([facility])   Specimen: Nasopharyngeal(NP) swabs in vial transport medium   NASOPHARYNGE  TESTING  Result Value Ref Range   SARS-CoV-2, NAA Not Detected Not Detected      Assessment & Plan:   Problem List Items Addressed This Visit      Cardiovascular and Mediastinum   Hypertension    Better on recheck. Will continue to monitor at home. Call with any concerns. Microalbumin checked today.      Relevant Orders   Microalbumin, Urine Waived     Other   Obesity    Discussed diet and exercise with goal of losing 1-2lbs per  week. Continue to monitor.       Relevant Orders   Bayer DCA Hb A1c Waived    Other Visit Diagnoses    Routine general medical examination at a health care facility    -  Primary   Vaccines up to date. Screening labs checked today. Declines colonoscopy. Continue diet and exercise. Call with any concerns.    Relevant Orders   CBC with Differential/Platelet   Bayer DCA Hb A1c Waived   Comprehensive metabolic panel   Lipid Panel w/o Chol/HDL Ratio   HIV Antibody (routine testing w rflx)   Microalbumin, Urine Waived   PSA   TSH   UA/M w/rflx Culture, Routine       LABORATORY TESTING:  Health maintenance labs ordered today as discussed above.   The natural history of prostate cancer and ongoing controversy regarding screening and potential treatment outcomes of prostate cancer has been discussed with the patient. The meaning of a false  positive PSA and a false negative PSA has been discussed. He indicates understanding of the limitations of this screening test and wishes to proceed with screening PSA testing.   IMMUNIZATIONS:   - Tdap: Tetanus vaccination status reviewed: last tetanus booster within 10 years. - Influenza: Up to date - Pneumovax: Not applicable  SCREENING: - Colonoscopy: Refused  Discussed with patient purpose of the colonoscopy is to detect colon cancer at curable precancerous or early stages   PATIENT COUNSELING:    Sexuality: Discussed sexually transmitted diseases, partner selection, use of condoms, avoidance of unintended pregnancy  and contraceptive alternatives.   Advised to avoid cigarette smoking.  I discussed with the patient that most people either abstain from alcohol or drink within safe limits (<=14/week and <=4 drinks/occasion for males, <=7/weeks and <= 3 drinks/occasion for females) and that the risk for alcohol disorders and other health effects rises proportionally with the number of drinks per week and how often a drinker exceeds daily limits.  Discussed cessation/primary prevention of drug use and availability of treatment for abuse.   Diet: Encouraged to adjust caloric intake to maintain  or achieve ideal body weight, to reduce intake of dietary saturated fat and total fat, to limit sodium intake by avoiding high sodium foods and not adding table salt, and to maintain adequate dietary potassium and calcium preferably from fresh fruits, vegetables, and low-fat dairy products.    stressed the importance of regular exercise  Injury prevention: Discussed safety belts, safety helmets, smoke detector, smoking near bedding or upholstery.   Dental health: Discussed importance of regular tooth brushing, flossing, and dental visits.   Follow up plan: NEXT PREVENTATIVE PHYSICAL DUE IN 1 YEAR. Return in about 1 year (around 08/24/2020) for physical.

## 2019-08-25 NOTE — Assessment & Plan Note (Signed)
Better on recheck. Will continue to monitor at home. Call with any concerns. Microalbumin checked today.

## 2019-08-25 NOTE — Patient Instructions (Addendum)
We are recommending the vaccine to everyone who has not had an allergic reaction to any of the components of the vaccine. If you have specific questions about the vaccine, please bring them up with your health care provider to discuss them.   We will likely not be getting the vaccine in the office for the first rounds of vaccinations. The way they are releasing the vaccines is going to be through the health systems (like [facility], [facility], [facility], [facility]), through your county health department, or through the pharmacies.   The [facility] Department is giving vaccines to those 65+ and Health Care Workers Teachers and Child Care providers start 08/17/19 Call [phone] to schedule  If you are 65+ you can get a vaccine through [facility] by signing up for an appointment.  You can sign up by going to: [email or web address].  You can get more information by going to: [email or web address]  [employer] next door is giving the [employer]- you can call [phone] or stop by there to schedule.  Health Maintenance, Male Adopting a healthy lifestyle and getting preventive care are important in promoting health and wellness. Ask your health care provider about:  The right schedule for you to have regular tests and exams.  Things you can do on your own to prevent diseases and keep yourself healthy. What should I know about diet, weight, and exercise? Eat a healthy diet   Eat a diet that includes plenty of vegetables, fruits, low-fat dairy products, and lean protein.  Do not eat a lot of foods that are high in solid fats, added sugars, or sodium. Maintain a healthy weight Body mass index (BMI) is a measurement that can be used to identify possible weight problems. It estimates body fat based on height and weight. Your health care provider can help determine your BMI and help you achieve or maintain a healthy weight. Get regular exercise Get regular exercise. This is  one of the most important things you can do for your health. Most adults should:  Exercise for at least 150 minutes each week. The exercise should increase your heart rate and make you sweat (moderate-intensity exercise).  Do strengthening exercises at least twice a week. This is in addition to the moderate-intensity exercise.  Spend less time sitting. Even light physical activity can be beneficial. Watch cholesterol and blood lipids Have your blood tested for lipids and cholesterol at 61 years of age, then have this test every 5 years. You may need to have your cholesterol levels checked more often if:  Your lipid or cholesterol levels are high.  You are older than 61 years of age.  You are at high risk for heart disease. What should I know about cancer screening? Many types of cancers can be detected early and may often be prevented. Depending on your health history and family history, you may need to have cancer screening at various ages. This may include screening for:  Colorectal cancer.  Prostate cancer.  Skin cancer.  Lung cancer. What should I know about heart disease, diabetes, and high blood pressure? Blood pressure and heart disease  High blood pressure causes heart disease and increases the risk of stroke. This is more likely to develop in people who have high blood pressure readings, are of African descent, or are overweight.  Talk with your health care provider about your target blood pressure readings.  Have your blood pressure checked: ? Every 3-5 years if you are 49-53 years of age. ? Every  year if you are 43 years old or older.  If you are between the ages of 23 and 69 and are a current or former smoker, ask your health care provider if you should have a one-time screening for abdominal aortic aneurysm (AAA). Diabetes Have regular diabetes screenings. This checks your fasting blood sugar level. Have the screening done:  Once every three years after age 15 if  you are at a normal weight and have a low risk for diabetes.  More often and at a younger age if you are overweight or have a high risk for diabetes. What should I know about preventing infection? Hepatitis B If you have a higher risk for hepatitis B, you should be screened for this virus. Talk with your health care provider to find out if you are at risk for hepatitis B infection. Hepatitis C Blood testing is recommended for:  Everyone born from [birth year] through 1965.  Anyone with known risk factors for hepatitis C. Sexually transmitted infections (STIs)  You should be screened each year for STIs, including gonorrhea and chlamydia, if: ? You are sexually active and are younger than 61 years of age. ? You are older than 61 years of age and your health care provider tells you that you are at risk for this type of infection. ? Your sexual activity has changed since you were last screened, and you are at increased risk for chlamydia or gonorrhea. Ask your health care provider if you are at risk.  Ask your health care provider about whether you are at high risk for HIV. Your health care provider may recommend a prescription medicine to help prevent HIV infection. If you choose to take medicine to prevent HIV, you should first get tested for HIV. You should then be tested every 3 months for as long as you are taking the medicine. Follow these instructions at home: Lifestyle  Do not use any products that contain nicotine or tobacco, such as cigarettes, e-cigarettes, and chewing tobacco. If you need help quitting, ask your health care provider.  Do not use street drugs.  Do not share needles.  Ask your health care provider for help if you need support or information about quitting drugs. Alcohol use  Do not drink alcohol if your health care provider tells you not to drink.  If you drink alcohol: ? Limit how much you have to 0-2 drinks a day. ? Be aware of how much alcohol is in your drink.  In the [place] one drink equals one 12 oz bottle of beer (355 mL), one 5 oz glass of wine (148 mL), or one 1 oz glass of hard liquor (44 mL). General instructions  Schedule regular health, dental, and eye exams.  Stay current with your vaccines.  Tell your health care provider if: ? You often feel depressed. ? You have ever been abused or do not feel safe at home. Summary  Adopting a healthy lifestyle and getting preventive care are important in promoting health and wellness.  Follow your health care provider's instructions about healthy diet, exercising, and getting tested or screened for diseases.  Follow your health care provider's instructions on monitoring your cholesterol and blood pressure. This information is not intended to replace advice given to you by your health care provider. Make sure you discuss any questions you have with your health care provider. Document Revised: 06/02/2018 Document Reviewed: 06/02/2018 Elsevier Patient Education  2020 [employer].

## 2019-08-25 NOTE — Assessment & Plan Note (Signed)
Discussed diet and exercise with goal of losing 1-2lbs per week. Continue to monitor.  

## 2019-08-26 LAB — LIPID PANEL W/O CHOL/HDL RATIO
Cholesterol, Total: 178 mg/dL (ref 100–199)
HDL: 49 mg/dL (ref >39)
LDL Chol Calc (NIH): 111 mg/dL — ABNORMAL HIGH (ref 0–99)
Triglycerides: 101 mg/dL (ref 0–149)
VLDL Cholesterol Cal: 18 mg/dL (ref 5–40)

## 2019-08-26 LAB — COMPREHENSIVE METABOLIC PANEL
ALT: 26 IU/L (ref 0–44)
AST: 24 IU/L (ref 0–40)
Albumin/Globulin Ratio: 2 (ref 1.2–2.2)
Albumin: 4.5 g/dL (ref 3.8–4.9)
Alkaline Phosphatase: 57 IU/L (ref 39–117)
BUN/Creatinine Ratio: 26 — ABNORMAL HIGH (ref 10–24)
BUN: 20 mg/dL (ref 8–27)
Bilirubin Total: 0.5 mg/dL (ref 0.0–1.2)
CO2: 20 mmol/L (ref 20–29)
Calcium: 9.4 mg/dL (ref 8.6–10.2)
Chloride: 102 mmol/L (ref 96–106)
Creatinine, Ser: 0.76 mg/dL (ref 0.76–1.27)
GFR calc Af Amer: 115 mL/min/{1.73_m2} (ref >59)
GFR calc non Af Amer: 99 mL/min/{1.73_m2} (ref >59)
Globulin, Total: 2.2 g/dL (ref 1.5–4.5)
Glucose: 86 mg/dL (ref 65–99)
Potassium: 4.3 mmol/L (ref 3.5–5.2)
Sodium: 137 mmol/L (ref 134–144)
Total Protein: 6.7 g/dL (ref 6.0–8.5)

## 2019-08-26 LAB — CBC WITH DIFFERENTIAL/PLATELET
Basophils Absolute: 0 10*3/uL (ref 0.0–0.2)
Basos: 1 %
EOS (ABSOLUTE): 0.2 10*3/uL (ref 0.0–0.4)
Eos: 4 %
Hematocrit: 46 % (ref 37.5–51.0)
Hemoglobin: 16 g/dL (ref 13.0–17.7)
Immature Grans (Abs): 0 10*3/uL (ref 0.0–0.1)
Immature Granulocytes: 0 %
Lymphocytes Absolute: 1.4 10*3/uL (ref 0.7–3.1)
Lymphs: 33 %
MCH: 31.9 pg (ref 26.6–33.0)
MCHC: 34.8 g/dL (ref 31.5–35.7)
MCV: 92 fL (ref 79–97)
Monocytes Absolute: 0.6 10*3/uL (ref 0.1–0.9)
Monocytes: 14 %
Neutrophils Absolute: 2.1 10*3/uL (ref 1.4–7.0)
Neutrophils: 48 %
Platelets: 207 10*3/uL (ref 150–450)
RBC: 5.02 x10E6/uL (ref 4.14–5.80)
RDW: 12.7 % (ref 11.6–15.4)
WBC: 4.2 10*3/uL (ref 3.4–10.8)

## 2019-08-26 LAB — TSH: TSH: 1.1 u[IU]/mL (ref 0.450–4.500)

## 2019-08-26 LAB — HIV ANTIBODY (ROUTINE TESTING W REFLEX): HIV Screen 4th Generation wRfx: NONREACTIVE

## 2019-08-26 LAB — PSA: Prostate Specific Ag, Serum: 0.6 ng/mL (ref 0.0–4.0)

## 2019-10-20 ENCOUNTER — Other Ambulatory Visit: Payer: Self-pay

## 2019-10-20 ENCOUNTER — Telehealth: Payer: Self-pay | Admitting: Nurse Practitioner

## 2019-10-20 ENCOUNTER — Encounter: Payer: Self-pay | Admitting: Nurse Practitioner

## 2019-10-20 ENCOUNTER — Ambulatory Visit (INDEPENDENT_AMBULATORY_CARE_PROVIDER_SITE_OTHER): Payer: BC Managed Care – PPO | Admitting: Nurse Practitioner

## 2019-10-20 VITALS — BP 126/80 | HR 67 | Temp 98.4°F | Wt 255.0 lb

## 2019-10-20 DIAGNOSIS — L03012 Cellulitis of left finger: Secondary | ICD-10-CM | POA: Insufficient documentation

## 2019-10-20 DIAGNOSIS — T63421A Toxic effect of venom of ants, accidental (unintentional), initial encounter: Secondary | ICD-10-CM | POA: Diagnosis not present

## 2019-10-20 MED ORDER — PREDNISONE 50 MG PO TABS: 50.0000 mg | ORAL_TABLET | Freq: Every day | ORAL | 0 refills | Status: DC

## 2019-10-20 MED ORDER — SULFAMETHOXAZOLE-TRIMETHOPRIM 400-80 MG PO TABS: 2.0000 | ORAL_TABLET | Freq: Two times a day (BID) | ORAL | 0 refills | Status: DC

## 2019-10-20 NOTE — Progress Notes (Signed)
BP 126/80   Pulse 67   Temp 98.4 F (36.9 C)   Wt 255 lb (115.7 kg)   SpO2 97%   BMI 36.92 kg/m    Subjective:    Patient ID: Vernon Murray, male    DOB: Sep 13, 1960, 59 y.o.   MRN: [id number]  HPI: Vernon Murray is a 59 y.o. male presenting for a bug bite on hand.  Chief Complaint  Patient presents with  . Insect Bite    left ring finger, happended two days ago, swelling and pain up forearm   SKIN LESION Patient reports fire ant bit base of left fourth finger.  He reports he was picking up bricks in his backyard. Duration: days Location: left hand Painful: no  Itching: yes  Fever: no Nausea/vomiting: no Fatigue: no Weakness: no Shortness of breath: no Trouble swallowing: no Onset: sudden Context: bigger Timing: constant Radiation: yes; up left arm Treatments attempted: Benadryl, benadryl cream, ice History of skin cancer: no History of precancerous skin lesions: no Family history of skin cancer: no    Allergies  Allergen Reactions  . Sulfur   . Valium [Diazepam]    Outpatient Encounter Medications as of 10/20/2019  Medication Sig  . cholecalciferol (VITAMIN D) 1000 units tablet Take 1,000 Units by mouth daily.  . predniSONE (DELTASONE) 50 MG tablet Take 1 tablet (50 mg total) by mouth daily with breakfast.  . sulfamethoxazole-trimethoprim (BACTRIM) 400-80 MG tablet Take 2 tablets by mouth 2 (two) times daily.   No facility-administered encounter medications on file as of 10/20/2019.   Patient Active Problem List   Diagnosis Date Noted  . Fire ant bite 10/20/2019  . Cellulitis of finger of left hand 10/20/2019  . Back pain 08/13/2016  . Hypertension 10/10/2014  . Depression 10/10/2014  . Obesity    Past Medical History:  Diagnosis Date  . Depression   . Elevated liver enzymes   . Fatty liver   . Hypertension   . Migraine   . Obesity    Relevant past medical, surgical, family and social history reviewed and updated as indicated. Interim  medical history since our last visit reviewed.  Review of Systems  Constitutional: Negative.  Negative for activity change, appetite change, fatigue and fever.  HENT: Negative.  Negative for trouble swallowing.   Respiratory: Negative.  Negative for shortness of breath and wheezing.   Gastrointestinal: Negative.  Negative for nausea and vomiting.  Skin: Positive for color change and wound. Negative for pallor and rash.  Neurological: Negative.  Negative for weakness.    Per HPI unless specifically indicated above     Objective:    BP 126/80   Pulse 67   Temp 98.4 F (36.9 C)   Wt 255 lb (115.7 kg)   SpO2 97%   BMI 36.92 kg/m   Wt Readings from Last 3 Encounters:  10/20/19 255 lb (115.7 kg)  08/25/19 254 lb 6 oz (115.4 kg)  08/20/18 250 lb (113.4 kg)    Physical Exam Vitals and nursing note reviewed.  Constitutional:      General: He is not in acute distress.    Appearance: Normal appearance.  HENT:     Mouth/Throat:     Mouth: Mucous membranes are moist.     Pharynx: Oropharynx is clear.  Eyes:     General: No scleral icterus.    Extraocular Movements: Extraocular movements intact.  Cardiovascular:     Rate and Rhythm: Normal rate and regular rhythm.  Pulses: Normal pulses.     Heart sounds: No murmur.  Pulmonary:     Effort: Pulmonary effort is normal. No respiratory distress.     Breath sounds: Normal breath sounds. No wheezing or rhonchi.  Musculoskeletal:     Right hand: Normal.     Left hand: Swelling present. Decreased range of motion (to fingers). Decreased strength. Normal sensation. Normal capillary refill. Normal pulse.  Skin:    General: Skin is warm.     Capillary Refill: Capillary refill takes less than 2 seconds.     Findings: Lesion (to left fourth finger) present.  Neurological:     Mental Status: He is alert.       Assessment & Plan:   Problem List Items Addressed This Visit      Other   Fire ant bite - Primary    Acute, ongoing.   No red flags on examination, good pulses and capillary refill.  Given significant swelling, will start burst of steroids.  If not better by tomorrow evening, instructed patient to also start antibiotic.   If swelling continues, fingers turn numb or pale, go to ED.  Follow up in clinic early next week to be sure of improvement.      Relevant Medications   predniSONE (DELTASONE) 50 MG tablet   sulfamethoxazole-trimethoprim (BACTRIM) 400-80 MG tablet   Cellulitis of finger of left hand    Acute, ongoing.  No red flags on examination, good pulses and capillary refill.  Given significant swelling, will start burst of steroids.  If not better by tomorrow evening, instructed patient to also start antibiotic.  If swelling continues, fingers turn numb or pale, go to ED.  Follow up in clinic early next week to be sure of improvement.      Relevant Medications   predniSONE (DELTASONE) 50 MG tablet   sulfamethoxazole-trimethoprim (BACTRIM) 400-80 MG tablet       Follow up plan: Return in about 5 days (around 10/25/2019) for bug bite follow up.

## 2019-10-20 NOTE — Patient Instructions (Addendum)
[employer], Vernon Murray.  Be sure you start the steroid today and the antibiotic by tomorrow evening if your hand is not feeling much better.  Let us know if you need anything in the meantime, take care! -[place], Adult An insect bite can make your skin red, itchy, and swollen. Some insects can spread disease to people with a bite. However, most insect bites do not lead to disease, and most are not serious. What are the causes? Insects may bite for many reasons, including:  Hunger.  To defend themselves. Insects that bite include:  Spiders.  Mosquitoes.  Ticks.  Fleas.  Ants.  Flies.  Kissing bugs.  Chiggers. What are the signs or symptoms? Symptoms of this condition include:  Itching or pain in the bite area.  Redness and swelling in the bite area.  An open wound (skin ulcer). Symptoms often last for 2-4 days. In rare cases, a person may have a very bad allergic reaction (anaphylactic reaction) to a bite. Symptoms of an anaphylactic reaction may include:  Feeling warm in the face (flushed). Your face may turn red.  Itchy, red, swollen areas of skin (hives).  Swelling of the: ? Eyes. ? Lips. ? Face. ? Mouth. ? Tongue. ? Throat.  Trouble with any of these: ? Breathing. ? Talking. ? Swallowing.  Loud breathing (wheezing).  Feeling dizzy or light-headed.  Passing out (fainting).  Pain or cramps in your belly.  Throwing up (vomiting).  Watery poop (diarrhea). How is this treated? Treatment is usually not needed. Symptoms often go away on their own. When treatment is needed, it may involve:  Putting a cream or lotion on the bite area. This helps with itching.  Taking an antibiotic medicine. This treatment is needed if the bite area gets infected.  Getting a tetanus shot, if you are not up to date on this vaccine.  Putting ice on the affected area.  Using medicines called antihistamines. This treatment may be needed if you have  itching or an allergic reaction to the insect bite.  Giving yourself a shot of medicine (epinephrine) using an auto-injector "pen" if you have an anaphylactic reaction to a bite. Your doctor will teach you how to use this pen. Follow these instructions at home: Bite area care   Do not scratch the bite area.  Keep the bite area clean and dry.  Wash the bite area every day with soap and water as told by your doctor.  Check the bite area every day for signs of infection. Check for: ? Redness, swelling, or pain. ? Fluid or blood. ? Warmth. ? Pus or a bad smell. Managing pain, itching, and swelling   You may put any of these on the bite area as told by your doctor: ? A paste made of baking soda and water. ? Cortisone cream. ? Calamine lotion.  If told, put ice on the bite area. ? Put ice in a plastic bag. ? Place a towel between your skin and the bag. ? Leave the ice on for 20 minutes, 2-3 times a day. General instructions  Apply or take over-the-counter and prescription medicines only as told by your doctor.  If you were prescribed an antibiotic medicine, take or apply it as told by your doctor. Do not stop using the antibiotic even if your condition improves.  Keep all follow-up visits as told by your doctor. This is important. How is this prevented? To help you have a lower risk of insect bites:  When you are outside, wear clothing that covers your arms and legs.  Use insect repellent. The best insect repellents contain one of these: ? DEET. ? Picaridin. ? Oil of lemon eucalyptus (OLE). ? IR3535.  Consider spraying your clothing with a pesticide called permethrin. Permethrin helps prevent insect bites. It works for several weeks and for up to 5-6 clothing washes. Do not apply permethrin directly to the skin.  If your home windows do not have screens, think about putting some in.  If you will be sleeping in an area where there are mosquitoes, consider covering your  sleeping area with a mosquito net. Contact a doctor if:  You have redness, swelling, or pain in the bite area.  You have fluid or blood coming from the bite area.  The bite area feels warm to the touch.  You have pus or a bad smell coming from the bite area.  You have a fever. Get help right away if:  You have joint pain.  You have a rash.  You feel more tired or sleepy than you normally do.  You have neck pain.  You have a headache.  You feel weaker than you normally do.  You have signs of an anaphylactic reaction. Signs may include: ? Feeling warm in the face. ? Itchy, red, swollen areas of skin. ? Swelling of your:  Eyes.  Lips.  Face.  Mouth.  Tongue.  Throat. ? Trouble with any of these:  Breathing.  Talking.  Swallowing. ? Loud breathing. ? Feeling dizzy or light-headed. ? Passing out. ? Pain or cramps in your belly. ? Throwing up. ? Watery poop. These symptoms may be an emergency. Do not wait to see if the symptoms will go away. Do this right away:  Use your auto-injector pen as you have been told.  Get medical help. Call your local emergency services (911 in the [place] Do not drive yourself to the hospital. Summary  An insect bite can make your skin red, itchy, and swollen.  Treatment is usually not needed. Symptoms often go away on their own.  Do not scratch the bite area. Keep it clean and dry.  Ice can help with pain and itching from the bite. This information is not intended to replace advice given to you by your health care provider. Make sure you discuss any questions you have with your health care provider. Document Revised: 12/18/2017 Document Reviewed: 12/18/2017 Elsevier Patient Education  2020 [employer].

## 2019-10-20 NOTE — Assessment & Plan Note (Addendum)
Acute, ongoing.  No red flags on examination, good pulses and capillary refill.  Given significant swelling, will start burst of steroids.  If not better by tomorrow evening, instructed patient to also start antibiotic.   If swelling continues, fingers turn numb or pale, go to ED.  Follow up in clinic early next week to be sure of improvement.

## 2019-10-20 NOTE — Telephone Encounter (Signed)
Entered in error

## 2019-10-20 NOTE — Assessment & Plan Note (Signed)
Acute, ongoing.  No red flags on examination, good pulses and capillary refill.  Given significant swelling, will start burst of steroids.  If not better by tomorrow evening, instructed patient to also start antibiotic.   If swelling continues, fingers turn numb or pale, go to ED.  Follow up in clinic early next week to be sure of improvement. 

## 2019-10-24 ENCOUNTER — Ambulatory Visit: Payer: BC Managed Care – PPO | Admitting: Family Medicine

## 2020-04-12 ENCOUNTER — Ambulatory Visit (INDEPENDENT_AMBULATORY_CARE_PROVIDER_SITE_OTHER): Payer: BC Managed Care – PPO

## 2020-04-12 ENCOUNTER — Other Ambulatory Visit: Payer: Self-pay

## 2020-04-12 DIAGNOSIS — Z23 Encounter for immunization: Secondary | ICD-10-CM | POA: Diagnosis not present

## 2020-08-28 ENCOUNTER — Encounter: Payer: BC Managed Care – PPO | Admitting: Family Medicine

## 2020-09-11 ENCOUNTER — Encounter: Payer: Self-pay | Admitting: Family Medicine

## 2020-09-11 ENCOUNTER — Ambulatory Visit (INDEPENDENT_AMBULATORY_CARE_PROVIDER_SITE_OTHER): Payer: 59 | Admitting: Family Medicine

## 2020-09-11 ENCOUNTER — Other Ambulatory Visit: Payer: Self-pay

## 2020-09-11 VITALS — BP 121/75 | HR 55 | Temp 97.7°F | Ht 70.25 in | Wt 258.6 lb

## 2020-09-11 DIAGNOSIS — Z0000 Encounter for general adult medical examination without abnormal findings: Secondary | ICD-10-CM | POA: Diagnosis not present

## 2020-09-11 DIAGNOSIS — H269 Unspecified cataract: Secondary | ICD-10-CM

## 2020-09-11 LAB — MICROSCOPIC EXAMINATION
Bacteria, UA: NONE SEEN
Epithelial Cells (non renal): NONE SEEN /hpf (ref 0–10)
WBC, UA: NONE SEEN /hpf (ref 0–5)

## 2020-09-11 LAB — URINALYSIS, ROUTINE W REFLEX MICROSCOPIC
Bilirubin, UA: NEGATIVE
Glucose, UA: NEGATIVE
Ketones, UA: NEGATIVE
Leukocytes,UA: NEGATIVE
Nitrite, UA: NEGATIVE
Protein,UA: NEGATIVE
Specific Gravity, UA: 1.02 (ref 1.005–1.030)
Urobilinogen, Ur: 0.2 mg/dL (ref 0.2–1.0)
pH, UA: 7 (ref 5.0–7.5)

## 2020-09-11 NOTE — Progress Notes (Signed)
BP 121/75   Pulse (!) 55   Temp 97.7 F (36.5 C)   Ht 5' 10.25" (1.784 m)   Wt 258 lb 9.6 oz (117.3 kg)   SpO2 98%   BMI 36.84 kg/m    Subjective:    Patient ID: Vernon Murray, male    DOB: July 19, 1960, 59 y.o.   MRN: [id number]  HPI: Vernon Murray is a 59 y.o. male presenting on 09/11/2020 for comprehensive medical examination. Current medical complaints include:none  He currently lives with: wife Interim Problems from his last visit: no  Depression Screen done today and results listed below:  Depression screen [facility] 2/9 09/11/2020 08/25/2019 08/20/2018 08/18/2017 08/13/2016  Decreased Interest 0 0 0 1 0  Down, Depressed, Hopeless 0 0 1 1 0  PHQ - 2 Score 0 0 1 2 0  Altered sleeping - 1 0 1 -  Tired, decreased energy - 0 1 0 -  Change in appetite - 0 0 0 -  Feeling bad or failure about yourself  - 1 1 1  -  Trouble concentrating - 0 1 1 -  Moving slowly or fidgety/restless - 0 0 0 -  Suicidal thoughts - 0 0 0 -  PHQ-9 Score - 2 4 5  -  Difficult doing work/chores - Not difficult at all - Somewhat difficult -    Past Medical History:  Past Medical History:  Diagnosis Date  . Depression   . Elevated liver enzymes   . Fatty liver   . Hypertension   . Migraine   . Obesity     Surgical History:  Past Surgical History:  Procedure Laterality Date  . GROIN MASS OPEN BIOPSY     non cancerous  . NASAL SINUS SURGERY      Medications:  Current Outpatient Medications on File Prior to Visit  Medication Sig  . cholecalciferol (VITAMIN D) 1000 units tablet Take 1,000 Units by mouth daily.   No current facility-administered medications on file prior to visit.    Allergies:  Allergies  Allergen Reactions  . Elemental Sulfur   . Valium [Diazepam]     Social History:  Social History   Socioeconomic History  . Marital status: Married    Spouse name: Not on file  . Number of children: Not on file  . Years of education: Not on file  . Highest education level: Not  on file  Occupational History  . Not on file  Tobacco Use  . Smoking status: Never Smoker  . Smokeless tobacco: Never Used  Vaping Use  . Vaping Use: Never used  Substance and Sexual Activity  . Alcohol use: Yes    Alcohol/week: 2.0 standard drinks    Types: 2 Cans of beer per week  . Drug use: No  . Sexual activity: Yes  Other Topics Concern  . Not on file  Social History Narrative  . Not on file   Social Determinants of Health   Financial Resource Strain: Not on file  Food Insecurity: Not on file  Transportation Needs: Not on file  Physical Activity: Not on file  Stress: Not on file  Social Connections: Not on file  Intimate Partner Violence: Not on file   Social History   Tobacco Use  Smoking Status Never Smoker  Smokeless Tobacco Never Used   Social History   Substance and Sexual Activity  Alcohol Use Yes  . Alcohol/week: 2.0 standard drinks  . Types: 2 Cans of beer per week    Family  History:  Family History  Problem Relation Age of Onset  . Hypertension Mother   . Stroke Mother 365       AVM or bleed in the brain x 2  . Pulmonary fibrosis Mother   . Hypertension Father   . Stroke Father   . Deep vein thrombosis Father   . Pulmonary embolism Father   . Pulmonary fibrosis Father   . Cancer Father        breast and prostate cancer  . Heart disease Paternal Grandfather   . COPD Neg Hx   . Diabetes Neg Hx     Past medical history, surgical history, medications, allergies, family history and social history reviewed with patient today and changes made to appropriate areas of the chart.   Review of Systems  Constitutional: Negative.   HENT: Negative.   Eyes: Positive for blurred vision. Negative for double vision, photophobia, pain, discharge and redness.  Respiratory: Negative.   Cardiovascular: Negative.   Gastrointestinal: Negative.   Genitourinary: Negative.   Musculoskeletal: Negative.   Skin: Negative.   Neurological: Negative.    Endo/Heme/Allergies: Positive for environmental allergies. Negative for polydipsia. Does not bruise/bleed easily.  Psychiatric/Behavioral: Negative.    All other ROS negative except what is listed above and in the HPI.      Objective:    BP 121/75   Pulse (!) 55   Temp 97.7 F (36.5 C)   Ht 5' 10.25" (1.784 m)   Wt 258 lb 9.6 oz (117.3 kg)   SpO2 98%   BMI 36.84 kg/m   Wt Readings from Last 3 Encounters:  09/11/20 258 lb 9.6 oz (117.3 kg)  10/20/19 255 lb (115.7 kg)  08/25/19 254 lb 6 oz (115.4 kg)    Physical Exam Vitals and nursing note reviewed.  Constitutional:      General: He is not in acute distress.    Appearance: Normal appearance. He is obese. He is not ill-appearing, toxic-appearing or diaphoretic.  HENT:     Head: Normocephalic and atraumatic.     Right Ear: Tympanic membrane, ear canal and external ear normal. There is no impacted cerumen.     Left Ear: Tympanic membrane, ear canal and external ear normal. There is no impacted cerumen.     Nose: Nose normal. No congestion or rhinorrhea.     Mouth/Throat:     Mouth: Mucous membranes are moist.     Pharynx: Oropharynx is clear. No oropharyngeal exudate or posterior oropharyngeal erythema.  Eyes:     General: No scleral icterus.       Right eye: No discharge.        Left eye: No discharge.     Extraocular Movements: Extraocular movements intact.     Conjunctiva/sclera: Conjunctivae normal.     Pupils: Pupils are equal, round, and reactive to light.  Neck:     Vascular: No carotid bruit.  Cardiovascular:     Rate and Rhythm: Normal rate and regular rhythm.     Pulses: Normal pulses.     Heart sounds: No murmur heard. No friction rub. No gallop.   Pulmonary:     Effort: Pulmonary effort is normal. No respiratory distress.     Breath sounds: Normal breath sounds. No stridor. No wheezing, rhonchi or rales.  Chest:     Chest wall: No tenderness.  Abdominal:     General: Abdomen is flat. Bowel sounds are  normal. There is no distension.     Palpations: Abdomen is soft. There  is no mass.     Tenderness: There is no abdominal tenderness. There is no right CVA tenderness, left CVA tenderness, guarding or rebound.     Hernia: No hernia is present.  Genitourinary:    Comments: Genital exam deferred with shared decision making Musculoskeletal:        General: No swelling, tenderness, deformity or signs of injury.     Cervical back: Normal range of motion and neck supple. No rigidity. No muscular tenderness.     Right lower leg: No edema.     Left lower leg: No edema.  Lymphadenopathy:     Cervical: No cervical adenopathy.  Skin:    General: Skin is warm and dry.     Capillary Refill: Capillary refill takes less than 2 seconds.     Coloration: Skin is not jaundiced or pale.     Findings: No bruising, erythema, lesion or rash.  Neurological:     General: No focal deficit present.     Mental Status: He is alert and oriented to person, place, and time.     Cranial Nerves: No cranial nerve deficit.     Sensory: No sensory deficit.     Motor: No weakness.     Coordination: Coordination normal.     Gait: Gait normal.     Deep Tendon Reflexes: Reflexes normal.  Psychiatric:        Mood and Affect: Mood normal.        Behavior: Behavior normal.        Thought Content: Thought content normal.        Judgment: Judgment normal.     Results for orders placed or performed in visit on 08/25/19  Microscopic Examination   BLD  Result Value Ref Range   WBC, UA None seen 0 - 5 /hpf   RBC 0-2 0 - 2 /hpf   Epithelial Cells (non renal) 0-10 0 - 10 /hpf   Bacteria, UA None seen None seen/Few  CBC with Differential/Platelet  Result Value Ref Range   WBC 4.2 3.4 - 10.8 x10E3/uL   RBC 5.02 4.14 - 5.80 x10E6/uL   Hemoglobin 16.0 13.0 - 17.7 g/dL   Hematocrit 16.1 09.6 - 51.0 %   MCV 92 79 - 97 fL   MCH 31.9 26.6 - 33.0 pg   MCHC 34.8 31.5 - 35.7 g/dL   RDW 04.5 40.9 - 81.1 %   Platelets 207 150 -  450 x10E3/uL   Neutrophils 48 Not Estab. %   Lymphs 33 Not Estab. %   Monocytes 14 Not Estab. %   Eos 4 Not Estab. %   Basos 1 Not Estab. %   Neutrophils Absolute 2.1 1.4 - 7.0 x10E3/uL   Lymphocytes Absolute 1.4 0.7 - 3.1 x10E3/uL   Monocytes Absolute 0.6 0.1 - 0.9 x10E3/uL   EOS (ABSOLUTE) 0.2 0.0 - 0.4 x10E3/uL   Basophils Absolute 0.0 0.0 - 0.2 x10E3/uL   Immature Granulocytes 0 Not Estab. %   Immature Grans (Abs) 0.0 0.0 - 0.1 x10E3/uL  Bayer DCA Hb A1c Waived  Result Value Ref Range   HB A1C (BAYER DCA - WAIVED) 5.0 <7.0 %  Comprehensive metabolic panel  Result Value Ref Range   Glucose 86 65 - 99 mg/dL   BUN 20 8 - 27 mg/dL   Creatinine, Ser 9.14 0.76 - 1.27 mg/dL   GFR calc non Af Amer 99 >59 mL/min/1.73   GFR calc Af Amer 115 >59 mL/min/1.73   BUN/Creatinine Ratio 26 (H)  10 - 24   Sodium 137 134 - 144 mmol/L   Potassium 4.3 3.5 - 5.2 mmol/L   Chloride 102 96 - 106 mmol/L   CO2 20 20 - 29 mmol/L   Calcium 9.4 8.6 - 10.2 mg/dL   Total Protein 6.7 6.0 - 8.5 g/dL   Albumin 4.5 3.8 - 4.9 g/dL   Globulin, Total 2.2 1.5 - 4.5 g/dL   Albumin/Globulin Ratio 2.0 1.2 - 2.2   Bilirubin Total 0.5 0.0 - 1.2 mg/dL   Alkaline Phosphatase 57 39 - 117 IU/L   AST 24 0 - 40 IU/L   ALT 26 0 - 44 IU/L  Lipid Panel w/o Chol/HDL Ratio  Result Value Ref Range   Cholesterol, Total 178 100 - 199 mg/dL   Triglycerides 505 0 - 149 mg/dL   HDL 49 >39 mg/dL   VLDL Cholesterol Cal 18 5 - 40 mg/dL   LDL Chol Calc (NIH) 767 (H) 0 - 99 mg/dL  HIV Antibody (routine testing w rflx)  Result Value Ref Range   HIV Screen 4th Generation wRfx Non Reactive Non Reactive  Microalbumin, Urine Waived  Result Value Ref Range   Microalb, Ur Waived 10 0 - 19 mg/L   Creatinine, Urine Waived 50 10 - 300 mg/dL   Microalb/Creat Ratio <30 <30 mg/g  PSA  Result Value Ref Range   Prostate Specific Ag, Serum 0.6 0.0 - 4.0 ng/mL  TSH  Result Value Ref Range   TSH 1.100 0.450 - 4.500 uIU/mL  UA/M w/rflx  Culture, Routine   Specimen: Blood   BLD  Result Value Ref Range   Specific Gravity, UA 1.015 1.005 - 1.030   pH, UA 6.0 5.0 - 7.5   Color, UA Yellow Yellow   Appearance Ur Clear Clear   Leukocytes,UA Negative Negative   Protein,UA Negative Negative/Trace   Glucose, UA Negative Negative   Ketones, UA Negative Negative   RBC, UA 1+ (A) Negative   Bilirubin, UA Negative Negative   Urobilinogen, Ur 0.2 0.2 - 1.0 mg/dL   Nitrite, UA Negative Negative   Microscopic Examination See below:       Assessment & Plan:   Problem List Items Addressed This Visit   None   Visit Diagnoses    Routine general medical examination at a health care facility    -  Primary   Vaccines up to date. Screening labs checked today. Colonoscopy declined. Continue diet and exercise. Call with any concerns.    Relevant Orders   Comprehensive metabolic panel   CBC with Differential/Platelet   Lipid Panel w/o Chol/HDL Ratio   PSA   TSH   Urinalysis, Routine w reflex microscopic   Cortical cataract of both eyes       Needs referral to eye doctor not in network. Wife is legally blind- concerned about who would do his surgery. Referral generated.    Relevant Orders   Ambulatory referral to Ophthalmology       Discussed aspirin prophylaxis for myocardial infarction prevention and decision was it was not indicated  LABORATORY TESTING:  Health maintenance labs ordered today as discussed above.   The natural history of prostate cancer and ongoing controversy regarding screening and potential treatment outcomes of prostate cancer has been discussed with the patient. The meaning of a false positive PSA and a false negative PSA has been discussed. He indicates understanding of the limitations of this screening test and wishes to proceed with screening PSA testing.   IMMUNIZATIONS:   -  Tdap: Tetanus vaccination status reviewed: last tetanus booster within 10 years. - Influenza: Up to date - Pneumovax: Not  applicable - COVID: Up to date  SCREENING: - Colonoscopy: Refused  Discussed with patient purpose of the colonoscopy is to detect colon cancer at curable precancerous or early stages   PATIENT COUNSELING:    Sexuality: Discussed sexually transmitted diseases, partner selection, use of condoms, avoidance of unintended pregnancy  and contraceptive alternatives.   Advised to avoid cigarette smoking.  I discussed with the patient that most people either abstain from alcohol or drink within safe limits (<=14/week and <=4 drinks/occasion for males, <=7/weeks and <= 3 drinks/occasion for females) and that the risk for alcohol disorders and other health effects rises proportionally with the number of drinks per week and how often a drinker exceeds daily limits.  Discussed cessation/primary prevention of drug use and availability of treatment for abuse.   Diet: Encouraged to adjust caloric intake to maintain  or achieve ideal body weight, to reduce intake of dietary saturated fat and total fat, to limit sodium intake by avoiding high sodium foods and not adding table salt, and to maintain adequate dietary potassium and calcium preferably from fresh fruits, vegetables, and low-fat dairy products.    stressed the importance of regular exercise  Injury prevention: Discussed safety belts, safety helmets, smoke detector, smoking near bedding or upholstery.   Dental health: Discussed importance of regular tooth brushing, flossing, and dental visits.   Follow up plan: NEXT PREVENTATIVE PHYSICAL DUE IN 1 YEAR. Return in about 1 year (around 09/11/2021) for physical.

## 2020-09-11 NOTE — Patient Instructions (Signed)

## 2020-09-12 LAB — COMPREHENSIVE METABOLIC PANEL
ALT: 23 IU/L (ref 0–44)
AST: 17 IU/L (ref 0–40)
Albumin/Globulin Ratio: 1.6 (ref 1.2–2.2)
Albumin: 4.2 g/dL (ref 3.8–4.8)
Alkaline Phosphatase: 60 IU/L (ref 44–121)
BUN/Creatinine Ratio: 18 (ref 10–24)
BUN: 14 mg/dL (ref 8–27)
Bilirubin Total: 0.5 mg/dL (ref 0.0–1.2)
CO2: 20 mmol/L (ref 20–29)
Calcium: 9.4 mg/dL (ref 8.6–10.2)
Chloride: 102 mmol/L (ref 96–106)
Creatinine, Ser: 0.79 mg/dL (ref 0.76–1.27)
Globulin, Total: 2.7 g/dL (ref 1.5–4.5)
Glucose: 90 mg/dL (ref 65–99)
Potassium: 4.4 mmol/L (ref 3.5–5.2)
Sodium: 139 mmol/L (ref 134–144)
Total Protein: 6.9 g/dL (ref 6.0–8.5)
eGFR: 101 mL/min/{1.73_m2} (ref >59)

## 2020-09-12 LAB — CBC WITH DIFFERENTIAL/PLATELET
Basophils Absolute: 0 10*3/uL (ref 0.0–0.2)
Basos: 1 %
EOS (ABSOLUTE): 0.2 10*3/uL (ref 0.0–0.4)
Eos: 4 %
Hematocrit: 48.2 % (ref 37.5–51.0)
Hemoglobin: 16.4 g/dL (ref 13.0–17.7)
Immature Grans (Abs): 0 10*3/uL (ref 0.0–0.1)
Immature Granulocytes: 0 %
Lymphocytes Absolute: 1.7 10*3/uL (ref 0.7–3.1)
Lymphs: 36 %
MCH: 31.2 pg (ref 26.6–33.0)
MCHC: 34 g/dL (ref 31.5–35.7)
MCV: 92 fL (ref 79–97)
Monocytes Absolute: 0.6 10*3/uL (ref 0.1–0.9)
Monocytes: 13 %
Neutrophils Absolute: 2.3 10*3/uL (ref 1.4–7.0)
Neutrophils: 46 %
Platelets: 201 10*3/uL (ref 150–450)
RBC: 5.26 x10E6/uL (ref 4.14–5.80)
RDW: 12.7 % (ref 11.6–15.4)
WBC: 4.8 10*3/uL (ref 3.4–10.8)

## 2020-09-12 LAB — LIPID PANEL W/O CHOL/HDL RATIO
Cholesterol, Total: 194 mg/dL (ref 100–199)
HDL: 50 mg/dL (ref >39)
LDL Chol Calc (NIH): 120 mg/dL — ABNORMAL HIGH (ref 0–99)
Triglycerides: 132 mg/dL (ref 0–149)
VLDL Cholesterol Cal: 24 mg/dL (ref 5–40)

## 2020-09-12 LAB — TSH: TSH: 1.25 u[IU]/mL (ref 0.450–4.500)

## 2020-09-12 LAB — PSA: Prostate Specific Ag, Serum: 1 ng/mL (ref 0.0–4.0)

## 2020-11-14 ENCOUNTER — Encounter: Payer: Self-pay | Admitting: Nurse Practitioner

## 2020-11-14 ENCOUNTER — Other Ambulatory Visit: Payer: Self-pay

## 2020-11-14 ENCOUNTER — Telehealth (INDEPENDENT_AMBULATORY_CARE_PROVIDER_SITE_OTHER): Payer: 59 | Admitting: Nurse Practitioner

## 2020-11-14 ENCOUNTER — Ambulatory Visit: Payer: Self-pay | Admitting: *Deleted

## 2020-11-14 ENCOUNTER — Telehealth: Payer: Self-pay

## 2020-11-14 VITALS — Ht 70.5 in | Wt 258.0 lb

## 2020-11-14 DIAGNOSIS — U071 COVID-19: Secondary | ICD-10-CM | POA: Diagnosis not present

## 2020-11-14 MED ORDER — MOLNUPIRAVIR EUA 200MG CAPSULE: 4.0000 | ORAL_CAPSULE | Freq: Two times a day (BID) | ORAL | 0 refills | Status: DC

## 2020-11-14 MED ORDER — MOLNUPIRAVIR EUA 200MG CAPSULE: 4.0000 | ORAL_CAPSULE | Freq: Two times a day (BID) | ORAL | 0 refills | Status: AC

## 2020-11-14 NOTE — Telephone Encounter (Signed)
Medication sent to [facility].  

## 2020-11-14 NOTE — Telephone Encounter (Signed)
Can we call patient and ask for an alternative pharmacy since [facility] does not have the medication?

## 2020-11-14 NOTE — Telephone Encounter (Signed)
Scheduled today with Karen

## 2020-11-14 NOTE — Telephone Encounter (Signed)
Copied from CRM [id number]. Topic: General - Other >> Nov 14, 2020  2:36 PM Marylen Ponto wrote: Reason for CRM: Pharmacist with [facility] called to advise that the Rx for molnupiravir EUA 200 mg CAPS is not available on the market.

## 2020-11-14 NOTE — Telephone Encounter (Signed)
Can we find out if they have Molnupirivir.

## 2020-11-14 NOTE — Telephone Encounter (Signed)
Pt's wife called to check status of request, please advise. Patient needs assistance  Best contact: [phone]  Synetta Fail even is willing for the original prescription to be sent elsewhere just let her know.

## 2020-11-14 NOTE — Telephone Encounter (Signed)
Pt called and stated that he has tested positive for covid and would like some advise on what he should do. Please advise  Called patient to review symptoms regarding covid positive status. Took at home covid test on Monday 11/12/20 after mowing grass and noted symptoms. C/o drainage down back of throat, chest congestion, body aches, sore throat, shortness of breath at times. Denies fever, difficulty breathing. Patient reports coughing up yellow sputum.reports feeling better to day.  Reviewed isolation precautions per CDC guidelines. Care advise given. Patient verbalized understanding of care advise and to call back or go to UC or ED if symptoms worsen. Please advise if virtual appt needed.  Reason for Disposition . [1] COVID-19 diagnosed by positive lab test (e.g., PCR, rapid self-test kit) AND [2] mild symptoms (e.g., cough, fever, others) AND [3] no complications or SOB  Answer Assessment - Initial Assessment Questions 1. COVID-19 DIAGNOSIS: "Who made your COVID-19 diagnosis?" "Was it confirmed by a positive lab test or self-test?" If not diagnosed by a doctor (or NP/PA), ask "Are there lots of cases (community spread) where you live?" Note: See public health department website, if unsure.     At home test  2. COVID-19 EXPOSURE: "Was there any known exposure to COVID before the symptoms began?" CDC Definition of close contact: within 6 feet (2 meters) for a total of 15 minutes or more over a 24-hour period.      Not sure  3. ONSET: "When did the COVID-19 symptoms start?"      Monday 11/12/20 4. WORST SYMPTOM: "What is your worst symptom?" (e.g., cough, fever, shortness of breath, muscle aches)     Chest congestion, body aches, sore throat, drainage down back of throat.  5. COUGH: "Do you have a cough?" If Yes, ask: "How bad is the cough?"       Yes . Productive with yellow sputum. 6. FEVER: "Do you have a fever?" If Yes, ask: "What is your temperature, how was it measured, and when did it start?"      na 7. RESPIRATORY STATUS: "Describe your breathing?" (e.g., shortness of breath, wheezing, unable to speak)      Some shortness of breath 8. BETTER-SAME-WORSE: "Are you getting better, staying the same or getting worse compared to yesterday?"  If getting worse, ask, "In what way?"     better 9. HIGH RISK DISEASE: "Do you have any chronic medical problems?" (e.g., asthma, heart or lung disease, weak immune system, obesity, etc.)     Hx asthma, chronic liver issues  10. VACCINE: "Have you had the COVID-19 vaccine?" If Yes, ask: "Which one, how many shots, when did you get it?"       Yes. Both Moderna vaccines 11. BOOSTER: "Have you received your COVID-19 booster?" If Yes, ask: "Which one and when did you get it?"       Yes moderna 12. PREGNANCY: "Is there any chance you are pregnant?" "When was your last menstrual period?"       na 13. OTHER SYMPTOMS: "Do you have any other symptoms?"  (e.g., chills, fatigue, headache, loss of smell or taste, muscle pain, sore throat)       Body aches, cough, chest congestion, sore throat.  14. O2 SATURATION MONITOR:  "Do you use an oxygen saturation monitor (pulse oximeter) at home?" If Yes, ask "What is your reading (oxygen level) today?" "What is your usual oxygen saturation reading?" (e.g., 95%)       na  Protocols used: CORONAVIRUS (COVID-19) DIAGNOSED OR   SUSPECTED-A-AH   

## 2020-11-14 NOTE — Telephone Encounter (Signed)
[facility] has the medication. Called and confirmed that the patient was ok with getting this medication from them.

## 2020-11-14 NOTE — Telephone Encounter (Signed)
Does anyone have room for him this PM? He has a bunch of risk factors and would likely benefit from orals?  If not I can see him double booked with another virtual tomorrow.

## 2020-11-14 NOTE — Telephone Encounter (Signed)
Called pt to schedule virtual no one has anything today offered tomorrow pt states that he thinks he will be ok it seems to be getting better advised of uc and mychart video

## 2020-11-14 NOTE — Telephone Encounter (Signed)
Called and spoke to pharmacist. That medication is not approved to be dispensed at retail pharmacies.

## 2020-11-14 NOTE — Progress Notes (Addendum)
Ht 5' 10.5" (1.791 m)   Wt 258 lb (117 kg)   BMI 36.50 kg/m    Subjective:    Patient ID: Vernon Murray, male    DOB: 10-Jan-1961, 60 y.o.   MRN: [id number]  HPI: Vernon Murray is a 60 y.o. male  Chief Complaint  Patient presents with  . Covid Positive    Tested positive for covid at home about 2 to 3 hours ago. Patient is having stuffy head, headache, chest congestion, cough and some mild joint pain.  Started on Monday after cutting the lawn.   COVID POSITIVE Patient states he took a home COVID test today and tested positive.  Patient started having headache, congestion, cough, and some mild joint pain.  Patient feels like he isn't breathing quite right but feels like it is because he can't breath through his nose.  Symptoms started on Monday.  Denies HA, CP, fever, SOB, dizziness, palpitations, visual changes, and lower extremity swelling.     Relevant past medical, surgical, family and social history reviewed and updated as indicated. Interim medical history since our last visit reviewed. Allergies and medications reviewed and updated.  Review of Systems  Constitutional: Negative for fever.  HENT: Positive for congestion.   Eyes: Negative for visual disturbance.  Respiratory: Positive for cough. Negative for shortness of breath.   Cardiovascular: Negative for chest pain and leg swelling.  Musculoskeletal: Positive for arthralgias.  Neurological: Negative for light-headedness and headaches.    Per HPI unless specifically indicated above     Objective:    Ht 5' 10.5" (1.791 m)   Wt 258 lb (117 kg)   BMI 36.50 kg/m   Wt Readings from Last 3 Encounters:  11/14/20 258 lb (117 kg)  09/11/20 258 lb 9.6 oz (117.3 kg)  10/20/19 255 lb (115.7 kg)    Physical Exam Vitals and nursing note reviewed.  Constitutional:      General: He is not in acute distress.    Appearance: He is not ill-appearing.  HENT:     Head: Normocephalic.     Right Ear: Hearing normal.      Left Ear: Hearing normal.     Nose: Nose normal.  Pulmonary:     Effort: Pulmonary effort is normal. No respiratory distress.  Neurological:     Mental Status: He is alert.  Psychiatric:        Mood and Affect: Mood normal.        Behavior: Behavior normal.        Thought Content: Thought content normal.        Judgment: Judgment normal.     Results for orders placed or performed in visit on 09/11/20  Microscopic Examination   Urine  Result Value Ref Range   WBC, UA None seen 0 - 5 /hpf   RBC 3-10 (A) 0 - 2 /hpf   Epithelial Cells (non renal) None seen 0 - 10 /hpf   Bacteria, UA None seen None seen/Few  Comprehensive metabolic panel  Result Value Ref Range   Glucose 90 65 - 99 mg/dL   BUN 14 8 - 27 mg/dL   Creatinine, Ser 0.79 0.76 - 1.27 mg/dL   eGFR 101 >59 mL/min/1.73   BUN/Creatinine Ratio 18 10 - 24   Sodium 139 134 - 144 mmol/L   Potassium 4.4 3.5 - 5.2 mmol/L   Chloride 102 96 - 106 mmol/L   CO2 20 20 - 29 mmol/L   Calcium 9.4 8.6 - 10.2  mg/dL   Total Protein 6.9 6.0 - 8.5 g/dL   Albumin 4.2 3.8 - 4.8 g/dL   Globulin, Total 2.7 1.5 - 4.5 g/dL   Albumin/Globulin Ratio 1.6 1.2 - 2.2   Bilirubin Total 0.5 0.0 - 1.2 mg/dL   Alkaline Phosphatase 60 44 - 121 IU/L   AST 17 0 - 40 IU/L   ALT 23 0 - 44 IU/L  CBC with Differential/Platelet  Result Value Ref Range   WBC 4.8 3.4 - 10.8 x10E3/uL   RBC 5.26 4.14 - 5.80 x10E6/uL   Hemoglobin 16.4 13.0 - 17.7 g/dL   Hematocrit 48.2 37.5 - 51.0 %   MCV 92 79 - 97 fL   MCH 31.2 26.6 - 33.0 pg   MCHC 34.0 31.5 - 35.7 g/dL   RDW 12.7 11.6 - 15.4 %   Platelets 201 150 - 450 x10E3/uL   Neutrophils 46 Not Estab. %   Lymphs 36 Not Estab. %   Monocytes 13 Not Estab. %   Eos 4 Not Estab. %   Basos 1 Not Estab. %   Neutrophils Absolute 2.3 1.4 - 7.0 x10E3/uL   Lymphocytes Absolute 1.7 0.7 - 3.1 x10E3/uL   Monocytes Absolute 0.6 0.1 - 0.9 x10E3/uL   EOS (ABSOLUTE) 0.2 0.0 - 0.4 x10E3/uL   Basophils Absolute 0.0 0.0 - 0.2  x10E3/uL   Immature Granulocytes 0 Not Estab. %   Immature Grans (Abs) 0.0 0.0 - 0.1 x10E3/uL  Lipid Panel w/o Chol/HDL Ratio  Result Value Ref Range   Cholesterol, Total 194 100 - 199 mg/dL   Triglycerides 132 0 - 149 mg/dL   HDL 50 >39 mg/dL   VLDL Cholesterol Cal 24 5 - 40 mg/dL   LDL Chol Calc (NIH) 120 (H) 0 - 99 mg/dL  PSA  Result Value Ref Range   Prostate Specific Ag, Serum 1.0 0.0 - 4.0 ng/mL  TSH  Result Value Ref Range   TSH 1.250 0.450 - 4.500 uIU/mL  Urinalysis, Routine w reflex microscopic  Result Value Ref Range   Specific Gravity, UA 1.020 1.005 - 1.030   pH, UA 7.0 5.0 - 7.5   Color, UA Yellow Yellow   Appearance Ur Clear Clear   Leukocytes,UA Negative Negative   Protein,UA Negative Negative/Trace   Glucose, UA Negative Negative   Ketones, UA Negative Negative   RBC, UA 2+ (A) Negative   Bilirubin, UA Negative Negative   Urobilinogen, Ur 0.2 0.2 - 1.0 mg/dL   Nitrite, UA Negative Negative   Microscopic Examination See below:       Assessment & Plan:   Problem List Items Addressed This Visit   None   Visit Diagnoses    COVID-19    -  Primary   Molnupirivir ordered for patient. S/E and benefits discussed with patient during visit. Reviewed S/S to monitor for and when to see higher level of care.   Relevant Medications   molnupiravir EUA 200 mg CAPS       Follow up plan: Return if symptoms worsen or fail to improve.    This visit was completed via MyChart due to the restrictions of the COVID-19 pandemic. All issues as above were discussed and addressed. Physical exam was done as above through visual confirmation on MyChart. If it was felt that the patient should be evaluated in the office, they were directed there. The patient verbally consented to this visit. 1. Location of the patient: Home 2. Location of the provider: Office 3. Those involved with this  call:  ? Provider: Jon Billings, NP ? CMA: Jodie Echevaria, CMA ? Front Desk/Registration:  [facility] 4. Time spent on call: 15 minutes with patient face to face via video conference. More than 50% of this time was spent in counseling and coordination of care. 20 minutes total spent in review of patient's record and preparation of their chart.

## 2020-11-15 ENCOUNTER — Telehealth: Payer: Self-pay

## 2020-11-15 NOTE — Telephone Encounter (Signed)
Copied from CRM [id number]. Topic: General - Other >> Nov 14, 2020  4:32 PM Pawlus, Vernon Murray wrote: Reason for CRM: Pt stated that [facility] is out of stock of molnupiravir EUA 200 mg CAPS, pt wants to know if this can be called in to [facility] where it is in stock. Please advise.

## 2020-11-15 NOTE — Telephone Encounter (Signed)
[facility].

## 2020-12-03 ENCOUNTER — Encounter: Payer: Self-pay | Admitting: Nurse Practitioner

## 2020-12-03 ENCOUNTER — Other Ambulatory Visit: Payer: Self-pay

## 2020-12-03 ENCOUNTER — Ambulatory Visit (INDEPENDENT_AMBULATORY_CARE_PROVIDER_SITE_OTHER): Payer: 59 | Admitting: Nurse Practitioner

## 2020-12-03 ENCOUNTER — Ambulatory Visit
Admission: RE | Admit: 2020-12-03 | Discharge: 2020-12-03 | Disposition: A | Discharge status: Home / Self Care | Payer: 59 | Source: Ambulatory Visit | Attending: Nurse Practitioner | Admitting: Nurse Practitioner

## 2020-12-03 ENCOUNTER — Ambulatory Visit
Admission: RE | Admit: 2020-12-03 | Discharge: 2020-12-03 | Disposition: A | Discharge status: Home / Self Care | Payer: 59 | Attending: Nurse Practitioner | Admitting: Nurse Practitioner

## 2020-12-03 VITALS — BP 128/80 | HR 77 | Temp 97.7°F | Wt 255.0 lb

## 2020-12-03 DIAGNOSIS — W19XXXA Unspecified fall, initial encounter: Secondary | ICD-10-CM

## 2020-12-03 DIAGNOSIS — R0781 Pleurodynia: Secondary | ICD-10-CM

## 2020-12-03 DIAGNOSIS — Z8616 Personal history of COVID-19: Secondary | ICD-10-CM | POA: Insufficient documentation

## 2020-12-03 DIAGNOSIS — M25511 Pain in right shoulder: Secondary | ICD-10-CM

## 2020-12-03 DIAGNOSIS — M62838 Other muscle spasm: Secondary | ICD-10-CM | POA: Diagnosis not present

## 2020-12-03 DIAGNOSIS — R0602 Shortness of breath: Secondary | ICD-10-CM

## 2020-12-03 MED ORDER — TRAMADOL HCL 50 MG PO TABS: 50.0000 mg | ORAL_TABLET | Freq: Three times a day (TID) | ORAL | 0 refills | Status: AC | PRN

## 2020-12-03 MED ORDER — CYCLOBENZAPRINE HCL 10 MG PO TABS: 10.0000 mg | ORAL_TABLET | Freq: Three times a day (TID) | ORAL | 0 refills | Status: AC | PRN

## 2020-12-03 NOTE — Progress Notes (Signed)
BP 128/80   Pulse 77   Temp 97.7 F (36.5 C)   Wt 255 lb (115.7 kg)   SpO2 96%   BMI 36.07 kg/m    Subjective:    Patient ID: Vernon Murray, male    DOB: 07/07/1960, 60 y.o.   MRN: [id number]  HPI: Vernon Murray is a 59 y.o. 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   Duration: days Involved shoulder: right Mechanism of injury: trauma Location: anterior Onset:sudden Severity: severe  Quality:  sharp and aching Frequency:  Constant but worse when breathing Radiation: no Aggravating factors:  breathing and movement  Alleviating factors:  Aleve helped but had to stop taking it due to his stomach being upset.   Status: stable Treatments attempted:  red light therapy and aleve  Relief with NSAIDs?:  moderate Weakness: no Numbness: no Decreased grip strength: no Redness: no Swelling: yes Bruising: yes Fevers: no  SHORTNESS OF BREATH Patient had COVID a few weeks ago and still complains of shortness of breath occasionally.   Relevant past medical, surgical, family and social history reviewed and updated as indicated. Interim medical history since our last visit reviewed. Allergies and medications reviewed and updated.  Review of Systems  Musculoskeletal:        Right shoulder pain, right sided rib pain and back pain.   Per HPI unless specifically indicated above     Objective:    BP 128/80   Pulse 77   Temp 97.7 F (36.5 C)   Wt 255 lb (115.7 kg)   SpO2 96%   BMI 36.07 kg/m   Wt Readings from Last 3 Encounters:  12/03/20 255 lb (115.7 kg)  11/14/20 258 lb (117 kg)  09/11/20 258 lb 9.6 oz (117.3 kg)    Physical Exam Vitals and nursing note reviewed.  Constitutional:      General: He is not in acute distress.    Appearance: Normal  appearance. He is not ill-appearing, toxic-appearing or diaphoretic.  HENT:     Head: Normocephalic.     Right Ear: External ear normal.     Left Ear: External ear normal.     Nose: Nose normal. No congestion or rhinorrhea.     Mouth/Throat:     Mouth: Mucous membranes are moist.  Eyes:     General:        Right eye: No discharge.        Left eye: No discharge.     Extraocular Movements: Extraocular movements intact.     Conjunctiva/sclera: Conjunctivae normal.     Pupils: Pupils are equal, round, and reactive to light.  Cardiovascular:     Rate and Rhythm: Normal rate and regular rhythm.     Heart sounds: No murmur heard. Pulmonary:     Effort: Pulmonary effort is normal. No respiratory distress.     Breath sounds: Normal breath sounds. No wheezing, rhonchi or rales.  Abdominal:     General: Abdomen is flat. Bowel sounds are normal.  Musculoskeletal:     Right shoulder: Tenderness present. No swelling. Decreased range of motion.     Left shoulder: Normal.     Cervical back: Normal range of motion and neck supple. Spasms and tenderness present.  Back:  Skin:    General: Skin is warm and dry.     Capillary Refill: Capillary refill takes less than 2 seconds.  Neurological:     General: No focal deficit present.     Mental Status: He is alert and oriented to person, place, and time.  Psychiatric:        Mood and Affect: Mood normal.        Behavior: Behavior normal.        Thought Content: Thought content normal.        Judgment: Judgment normal.    Results for orders placed or performed in visit on 09/11/20  Microscopic Examination   Urine  Result Value Ref Range   WBC, UA None seen 0 - 5 /hpf   RBC 3-10 (A) 0 - 2 /hpf   Epithelial Cells (non renal) None seen 0 - 10 /hpf   Bacteria, UA None seen None seen/Few  Comprehensive metabolic panel  Result Value Ref Range   Glucose 90 65 - 99 mg/dL   BUN 14 8 - 27 mg/dL   Creatinine, Ser 0.79 0.76 - 1.27 mg/dL   eGFR  101 >59 mL/min/1.73   BUN/Creatinine Ratio 18 10 - 24   Sodium 139 134 - 144 mmol/L   Potassium 4.4 3.5 - 5.2 mmol/L   Chloride 102 96 - 106 mmol/L   CO2 20 20 - 29 mmol/L   Calcium 9.4 8.6 - 10.2 mg/dL   Total Protein 6.9 6.0 - 8.5 g/dL   Albumin 4.2 3.8 - 4.8 g/dL   Globulin, Total 2.7 1.5 - 4.5 g/dL   Albumin/Globulin Ratio 1.6 1.2 - 2.2   Bilirubin Total 0.5 0.0 - 1.2 mg/dL   Alkaline Phosphatase 60 44 - 121 IU/L   AST 17 0 - 40 IU/L   ALT 23 0 - 44 IU/L  CBC with Differential/Platelet  Result Value Ref Range   WBC 4.8 3.4 - 10.8 x10E3/uL   RBC 5.26 4.14 - 5.80 x10E6/uL   Hemoglobin 16.4 13.0 - 17.7 g/dL   Hematocrit 48.2 37.5 - 51.0 %   MCV 92 79 - 97 fL   MCH 31.2 26.6 - 33.0 pg   MCHC 34.0 31.5 - 35.7 g/dL   RDW 12.7 11.6 - 15.4 %   Platelets 201 150 - 450 x10E3/uL   Neutrophils 46 Not Estab. %   Lymphs 36 Not Estab. %   Monocytes 13 Not Estab. %   Eos 4 Not Estab. %   Basos 1 Not Estab. %   Neutrophils Absolute 2.3 1.4 - 7.0 x10E3/uL   Lymphocytes Absolute 1.7 0.7 - 3.1 x10E3/uL   Monocytes Absolute 0.6 0.1 - 0.9 x10E3/uL   EOS (ABSOLUTE) 0.2 0.0 - 0.4 x10E3/uL   Basophils Absolute 0.0 0.0 - 0.2 x10E3/uL   Immature Granulocytes 0 Not Estab. %   Immature Grans (Abs) 0.0 0.0 - 0.1 x10E3/uL  Lipid Panel w/o Chol/HDL Ratio  Result Value Ref Range   Cholesterol, Total 194 100 - 199 mg/dL   Triglycerides 132 0 - 149 mg/dL   HDL 50 >39 mg/dL   VLDL Cholesterol Cal 24 5 - 40 mg/dL   LDL Chol Calc (NIH) 120 (H) 0 - 99 mg/dL  PSA  Result Value Ref Range   Prostate Specific Ag, Serum 1.0 0.0 - 4.0 ng/mL  TSH  Result Value Ref Range   TSH 1.250 0.450 - 4.500 uIU/mL  Urinalysis, Routine w reflex microscopic  Result Value Ref Range   Specific  Gravity, UA 1.020 1.005 - 1.030   pH, UA 7.0 5.0 - 7.5   Color, UA Yellow Yellow   Appearance Ur Clear Clear   Leukocytes,UA Negative Negative   Protein,UA Negative Negative/Trace   Glucose, UA Negative Negative   Ketones,  UA Negative Negative   RBC, UA 2+ (A) Negative   Bilirubin, UA Negative Negative   Urobilinogen, Ur 0.2 0.2 - 1.0 mg/dL   Nitrite, UA Negative Negative   Microscopic Examination See below:       Assessment & Plan:   Problem List Items Addressed This Visit   None Visit Diagnoses     Fall, initial encounter    -  Primary   Will obtain xrays of shoulder, ribs and chest to evaluate for injury. If needed will order MRI of shoulder. Will make further recommendations based on imaging.   Relevant Orders   DG Chest 2 View   DG Shoulder Right   Acute pain of right shoulder       Xrays ordered. Will make recommendations based on imaging. Can use Tramadol PRN for pain.   Relevant Medications   cyclobenzaprine (FLEXERIL) 10 MG tablet   traMADol (ULTRAM) 50 MG tablet   Other Relevant Orders   DG Shoulder Right   DG Ribs Unilateral Right   Rib pain       Xrays ordered to evaluate for displaced or fractured rib.  Will make recommendations based on imaging.    Relevant Orders   DG Chest 2 View   Muscle spasm       Flexeril given for muscle spasm. Patient has appointment with Chiropractor. Follow up if symptoms do not improve.    Relevant Medications   cyclobenzaprine (FLEXERIL) 10 MG tablet   traMADol (ULTRAM) 50 MG tablet   Shortness of breath       Chest xray ordered to evaluate lungs post COVID. Patient complains of ongoing SOB.         Follow up plan: Return if symptoms worsen or fail to improve.   A total of 30 minutes were spent on this encounter today.  When total time is documented, this includes both the face-to-face and non-face-to-face time personally spent before, during and after the visit on the date of the encounter.

## 2020-12-04 NOTE — Progress Notes (Signed)
Hi Tu.  Your chest xray was normal.  I will let you know when the rest of the imaging is back.

## 2020-12-04 NOTE — Progress Notes (Signed)
Hi Vernon Murray. Your shoulder xray did not show any bony abnormality.  If you would like I can go ahead and order the MRI to evaluate for any soft tissue injury.

## 2020-12-04 NOTE — Progress Notes (Signed)
No Displaced or fractured ribs on xray.

## 2020-12-12 ENCOUNTER — Other Ambulatory Visit: Payer: Self-pay

## 2020-12-12 ENCOUNTER — Ambulatory Visit
Admission: EM | Admit: 2020-12-12 | Discharge: 2020-12-12 | Disposition: A | Discharge status: Home / Self Care | Payer: 59 | Attending: Sports Medicine | Admitting: Sports Medicine

## 2020-12-12 DIAGNOSIS — R0781 Pleurodynia: Secondary | ICD-10-CM

## 2020-12-12 DIAGNOSIS — W19XXXA Unspecified fall, initial encounter: Secondary | ICD-10-CM | POA: Diagnosis not present

## 2020-12-12 DIAGNOSIS — R0602 Shortness of breath: Secondary | ICD-10-CM

## 2020-12-12 DIAGNOSIS — M62838 Other muscle spasm: Secondary | ICD-10-CM

## 2020-12-12 MED ORDER — ETODOLAC 500 MG PO TABS: 500.0000 mg | ORAL_TABLET | Freq: Two times a day (BID) | ORAL | 0 refills | Status: AC

## 2020-12-12 MED ORDER — METHOCARBAMOL 500 MG PO TABS: 500.0000 mg | ORAL_TABLET | Freq: Two times a day (BID) | ORAL | 0 refills | Status: AC

## 2020-12-12 NOTE — ED Provider Notes (Signed)
[facility]    CSN: [id number] Arrival date & time: 12/12/20  1251      History   Chief Complaint Chief Complaint  Patient presents with   Fall   Rib Injury    right   Shortness of Breath    HPI Vernon Murray is a 60 y.o. male.   Patient is a pleasant 60 year old male who presents for evaluation of right-sided rib pain.  Normally sees Dr. Olevia PerchesMegan Johnson for his ongoing medical care.  He is retired and does not work outside the home.  He relates his symptoms to a fall that occurred on 12/01/2020.  He landed awkwardly on some concrete on the right side of his rib cage.  He has had associated ecchymosis that is resolving.  He denies any loss of consciousness or head trauma.  He was seen by his primary care physician on 12/03/2020 and had x-rays of the chest, ribs, and shoulder which were all negative.  Subsequently was seen by a chiropractor and had repeat x-rays done.  This was on the same day 12/03/2020.  Was told that he had subluxed 3 ribs on the right side.  He had a manipulation on that day and 1 other manipulation which she says has helped.  He was diagnosed with COVID on May 25 and had a positive home test.  His symptoms resolved within about 5 days.  He comes in today to the urgent care because he has had increasing shortness of breath the last few days.  No fever shakes chills.  No nausea vomiting diarrhea.  He has no significant medical conditions.  He does not take medicines on a regular basis.  When he was seen by his primary care physician he was given Flexeril but he stopped that over the weekend because he feels as though he is somewhat funny when he takes it and feels as though he cannot think clearly.  He is also been taking Aleve but he stopped that due to GI upset.  He only has his symptoms with activity and exertion.  He has no symptoms at rest.  No red flag signs or symptoms elicited on history.   Past Medical History:  Diagnosis Date   Depression     Elevated liver enzymes    Fatty liver    Hypertension    Migraine    Obesity     Patient Active Problem List   Diagnosis Date Noted   Fire ant bite 10/20/2019   Cellulitis of finger of left hand 10/20/2019   Back pain 08/13/2016   Hypertension 10/10/2014   Depression 10/10/2014   Obesity     Past Surgical History:  Procedure Laterality Date   GROIN MASS OPEN BIOPSY     non cancerous   NASAL SINUS SURGERY         Home Medications    Prior to Admission medications   Medication Sig Start Date End Date Taking? Authorizing Provider  etodolac (LODINE) 500 MG tablet Take 1 tablet (500 mg total) by mouth 2 (two) times daily. 12/12/20  Yes Delton SeeBarnes, Krithika Tome, MD  methocarbamol (ROBAXIN) 500 MG tablet Take 1 tablet (500 mg total) by mouth 2 (two) times daily. 12/12/20  Yes Delton SeeBarnes, Jancie Kercher, MD  cholecalciferol (VITAMIN D) 1000 units tablet Take 1,000 Units by mouth daily.    [provider]  cyclobenzaprine (FLEXERIL) 10 MG tablet Take 1 tablet (10 mg total) by mouth 3 (three) times daily as needed for muscle spasms. 12/03/20  Larae Grooms, NP    Family History Family History  Problem Relation Age of Onset   Hypertension Mother    Stroke Mother 103       AVM or bleed in the brain x 2   Pulmonary fibrosis Mother    Hypertension Father    Stroke Father    Deep vein thrombosis Father    Pulmonary embolism Father    Pulmonary fibrosis Father    Cancer Father        breast and prostate cancer   Heart disease Paternal Grandfather    COPD Neg Hx    Diabetes Neg Hx     Social History Social History   Tobacco Use   Smoking status: Never   Smokeless tobacco: Never  Vaping Use   Vaping Use: Never used  Substance Use Topics   Alcohol use: Yes    Alcohol/week: 2.0 standard drinks    Types: 2 Cans of beer per week   Drug use: No     Allergies   Elemental sulfur and Valium [diazepam]   Review of Systems Review of Systems  Constitutional:  Positive for  activity change. Negative for appetite change, chills, diaphoresis, fatigue and fever.  HENT:  Negative for congestion, ear pain, postnasal drip, rhinorrhea, sinus pressure, sinus pain, sneezing and sore throat.   Eyes:  Negative for pain.  Respiratory:  Positive for shortness of breath. Negative for cough and chest tightness.   Cardiovascular:  Positive for chest pain. Negative for palpitations.       Right sided rib pain  Gastrointestinal:  Negative for abdominal pain, diarrhea, nausea and vomiting.  Genitourinary:  Negative for dysuria.  Musculoskeletal:  Positive for arthralgias. Negative for back pain, myalgias and neck pain.  Skin:  Positive for color change. Negative for pallor, rash and wound.  Neurological:  Negative for dizziness, light-headedness, numbness and headaches.  All other systems reviewed and are negative.   Physical Exam Triage Vital Signs ED Triage Vitals  Enc Vitals Group     BP 12/12/20 1309 123/82     Pulse Rate 12/12/20 1309 80     Resp 12/12/20 1309 18     Temp 12/12/20 1309 98.3 F (36.8 C)     Temp Source 12/12/20 1309 Oral     SpO2 12/12/20 1309 99 %     Weight 12/12/20 1302 255 lb (115.7 kg)     Height 12/12/20 1302 5' 10.5" (1.791 m)     Head Circumference --      Peak Flow --      Pain Score 12/12/20 1302 6     Pain Loc --      Pain Edu? --      Excl. in GC? --    No data found.  Updated Vital Signs BP 123/82 (BP Location: Left Arm)   Pulse 80   Temp 98.3 F (36.8 C) (Oral)   Resp 18   Ht 5' 10.5" (1.791 m)   Wt 115.7 kg   SpO2 99%   BMI 36.07 kg/m   Visual Acuity Right Eye Distance:   Left Eye Distance:   Bilateral Distance:    Right Eye Near:   Left Eye Near:    Bilateral Near:     Physical Exam Vitals reviewed.  Constitutional:      General: He is not in acute distress.    Appearance: Normal appearance. He is well-developed. He is not ill-appearing, toxic-appearing or diaphoretic.  HENT:     Head: Normocephalic  and  atraumatic.     Nose: Nose normal.     Mouth/Throat:     Mouth: Mucous membranes are moist.  Eyes:     General: No scleral icterus.    Conjunctiva/sclera: Conjunctivae normal.     Pupils: Pupils are equal, round, and reactive to light.  Cardiovascular:     Rate and Rhythm: Normal rate and regular rhythm.     Pulses: Normal pulses.     Heart sounds: Normal heart sounds. No murmur heard.   No friction rub. No gallop.  Pulmonary:     Effort: Pulmonary effort is normal.     Breath sounds: Normal breath sounds. No stridor. No decreased breath sounds, wheezing, rhonchi or rales.  Chest:     Chest wall: Tenderness present. No mass, lacerations, deformity, swelling, crepitus or edema. There is no dullness to percussion.     Comments: He has some tenderness palpation over the ribs mid axillary line and midclavicular line.  He is also tender over the ribs as they insert into the sternum.  There is no crepitus.  He is able to take a full deep breath which causes some discomfort but it is minimal at best. Abdominal:     General: There is no distension.     Palpations: Abdomen is soft. There is no hepatomegaly or splenomegaly.     Tenderness: There is no abdominal tenderness. There is no right CVA tenderness, left CVA tenderness, guarding or rebound.  Musculoskeletal:     Cervical back: Normal range of motion and neck supple.  Skin:    General: Skin is warm and dry.     Capillary Refill: Capillary refill takes less than 2 seconds.  Neurological:     General: No focal deficit present.     Mental Status: He is alert and oriented to person, place, and time.     UC Treatments / Results  Labs (all labs ordered are listed, but only abnormal results are displayed) Labs Reviewed - No data to display  EKG   Radiology No results found.  Procedures Procedures (including critical care time)  Medications Ordered in UC Medications - No data to display  Initial Impression / Assessment and Plan  / UC Course  I have reviewed the triage vital signs and the nursing notes.  Pertinent labs & imaging results that were available during my care of the patient were reviewed by me and considered in my medical decision making (see chart for details).  Clinical impression: Right-sided rib pain, status post fall on 12/01/2020.  Patient is now 10 days post injury.  He has had negative x-rays.  Has been seen by chiropractor and has been told that he subluxed some ribs.  He has had 2 manipulations which is helped.  Over the course of the last few days he has had increasing shortness of breath with activity.  He has some associated muscle spasm.  Treatment plan: 1.  The findings and treatment plan were discussed in detail with the patient.  Patient was in agreement. 2.  He does have a set of negative x-rays and I did not feel as though they needed to be repeated.  I also discussed that his abdomen did not concern me it was nontender.  There was a question of whether or not he may have some liver involvement.  His exam is benign from that perspective today.  In addition I did not feel as though any of this was related to his COVID infection 1 month  ago.  I did explain to him that I felt as though his shortness of breath was from splinting from his rib pain. 3.  I recommended he go back to his chiropractor, and he has an appointment tomorrow.  He may benefit from continued manipulations. 4.  I went ahead and give him a prescription strength anti-inflammatory.  No Motrin Advil ibuprofen Naprosyn or Aleve. 5.  Switched up his muscle relaxer here and prescribed Robaxin.  He really did not like the Flexeril. 6.  If his symptoms persisted and chiropractic care did not help he should go back to his primary care provider. 7.  If his symptoms worsen in any way he should go to the ER for higher level of care and consideration of advanced imaging. 8.  I went ahead and discharge him from care in stable condition and he  will follow-up here as needed.    Final Clinical Impressions(s) / UC Diagnoses   Final diagnoses:  Fall, initial encounter  Rib pain on right side  SOBOE (shortness of breath on exertion)  Muscle spasm     Discharge Instructions      As we discussed, your exam is consistent with your mechanism of injury.  X-rays were negative and I do not feel the need to repeat them.  Your abdomen does not concern me as it is soft and nontender.  No need to do labs today.  I do believe that your COVID infection 1 month ago has anything to do with your current symptoms.  I believe your shortness of breath is from splinting from your rib pain.  As we discussed I do not know anything about dislocated or subluxed ribs, and I would advise you to go back to your chiropractor to see if you need another manipulation. I did prescribe an anti-inflammatory that you will take twice a day.  No Motrin Advil ibuprofen Naprosyn Aleve or aspirin products.  I also prescribed a muscle relaxer that you can also take twice a day. If your symptoms persist you should see your primary care provider. Certainly if they worsen in any way go to the ER for higher level of care and advanced imaging.     ED Prescriptions     Medication Sig Dispense Auth. Provider   etodolac (LODINE) 500 MG tablet Take 1 tablet (500 mg total) by mouth 2 (two) times daily. 30 tablet Delton See, MD   methocarbamol (ROBAXIN) 500 MG tablet Take 1 tablet (500 mg total) by mouth 2 (two) times daily. 20 tablet Delton See, MD      PDMP not reviewed this encounter.   Delton See, MD 12/12/20 [phone]

## 2020-12-12 NOTE — ED Triage Notes (Addendum)
Pt c/o fall about a week and a half ago. Pt states he injured the ribs on the right side, and reports pain and shob continuing. Pt denies any other injuries from the fall. Pt states shob has increased over the past few days.   Pt's wife is also concerned about ongoing weakness. She states he had some liver issues several years ago after having a virus and she is concerned that his recent COVID could have aggravated this again. She is asking about liver function tests.

## 2020-12-12 NOTE — Discharge Instructions (Addendum)
As we discussed, your exam is consistent with your mechanism of injury.  X-rays were negative and I do not feel the need to repeat them.  Your abdomen does not concern me as it is soft and nontender.  No need to do labs today.  I do believe that your COVID infection 1 month ago has anything to do with your current symptoms.  I believe your shortness of breath is from splinting from your rib pain.  As we discussed I do not know anything about dislocated or subluxed ribs, and I would advise you to go back to your chiropractor to see if you need another manipulation. I did prescribe an anti-inflammatory that you will take twice a day.  No Motrin Advil ibuprofen Naprosyn Aleve or aspirin products.  I also prescribed a muscle relaxer that you can also take twice a day. If your symptoms persist you should see your primary care provider. Certainly if they worsen in any way go to the ER for higher level of care and advanced imaging.

## 2023-12-31 DIAGNOSIS — H903 Sensorineural hearing loss, bilateral: Secondary | ICD-10-CM | POA: Diagnosis not present
